# Patient Record
Sex: Female | Born: 1970 | Race: Black or African American | Hispanic: No | Marital: Single | Smoking: Former smoker
Health system: Southern US, Community
[De-identification: ages and names within clinical notes are randomized; demographics above are authoritative.]

## PROBLEM LIST (undated history)

## (undated) DIAGNOSIS — E063 Autoimmune thyroiditis: Secondary | ICD-10-CM

## (undated) DIAGNOSIS — I499 Cardiac arrhythmia, unspecified: Secondary | ICD-10-CM

## (undated) DIAGNOSIS — I10 Essential (primary) hypertension: Secondary | ICD-10-CM

## (undated) DIAGNOSIS — M5136 Other intervertebral disc degeneration, lumbar region: Secondary | ICD-10-CM

## (undated) DIAGNOSIS — E119 Type 2 diabetes mellitus without complications: Secondary | ICD-10-CM

## (undated) DIAGNOSIS — M5030 Other cervical disc degeneration, unspecified cervical region: Secondary | ICD-10-CM

## (undated) DIAGNOSIS — M51369 Other intervertebral disc degeneration, lumbar region without mention of lumbar back pain or lower extremity pain: Secondary | ICD-10-CM

## (undated) DIAGNOSIS — E0500 Thyrotoxicosis with diffuse goiter without thyrotoxic crisis or storm: Secondary | ICD-10-CM

## (undated) HISTORY — PX: CHOLECYSTECTOMY: SHX55

## (undated) HISTORY — PX: HERNIA REPAIR: SHX51

## (undated) HISTORY — PX: CARDIAC SURGERY: SHX584

## (undated) HISTORY — PX: ABDOMINAL HYSTERECTOMY: SHX81

---

## 2008-04-26 ENCOUNTER — Emergency Department (HOSPITAL_BASED_OUTPATIENT_CLINIC_OR_DEPARTMENT_OTHER): Admission: EM | Admit: 2008-04-26 | Discharge: 2008-04-26 | Payer: Self-pay | Admitting: Emergency Medicine

## 2008-04-26 ENCOUNTER — Ambulatory Visit: Payer: Self-pay | Admitting: Diagnostic Radiology

## 2008-05-26 ENCOUNTER — Emergency Department (HOSPITAL_COMMUNITY): Admission: EM | Admit: 2008-05-26 | Discharge: 2008-05-27 | Payer: Self-pay | Admitting: Emergency Medicine

## 2008-07-12 ENCOUNTER — Emergency Department (HOSPITAL_BASED_OUTPATIENT_CLINIC_OR_DEPARTMENT_OTHER): Admission: EM | Admit: 2008-07-12 | Discharge: 2008-07-12 | Payer: Self-pay | Admitting: Emergency Medicine

## 2008-07-24 ENCOUNTER — Emergency Department (HOSPITAL_BASED_OUTPATIENT_CLINIC_OR_DEPARTMENT_OTHER): Admission: EM | Admit: 2008-07-24 | Discharge: 2008-07-24 | Payer: Self-pay | Admitting: Emergency Medicine

## 2008-08-16 ENCOUNTER — Encounter: Admission: RE | Admit: 2008-08-16 | Discharge: 2008-09-22 | Payer: Self-pay | Admitting: Orthopaedic Surgery

## 2008-09-09 ENCOUNTER — Emergency Department (HOSPITAL_BASED_OUTPATIENT_CLINIC_OR_DEPARTMENT_OTHER): Admission: EM | Admit: 2008-09-09 | Discharge: 2008-09-09 | Payer: Self-pay | Admitting: Emergency Medicine

## 2008-10-30 ENCOUNTER — Ambulatory Visit: Payer: Self-pay | Admitting: Diagnostic Radiology

## 2008-10-30 ENCOUNTER — Emergency Department (HOSPITAL_BASED_OUTPATIENT_CLINIC_OR_DEPARTMENT_OTHER): Admission: EM | Admit: 2008-10-30 | Discharge: 2008-10-30 | Payer: Self-pay | Admitting: Emergency Medicine

## 2008-12-01 ENCOUNTER — Emergency Department (HOSPITAL_BASED_OUTPATIENT_CLINIC_OR_DEPARTMENT_OTHER): Admission: EM | Admit: 2008-12-01 | Discharge: 2008-12-02 | Payer: Self-pay | Admitting: Emergency Medicine

## 2008-12-19 ENCOUNTER — Ambulatory Visit (HOSPITAL_COMMUNITY): Admission: RE | Admit: 2008-12-19 | Discharge: 2008-12-19 | Payer: Self-pay | Admitting: General Practice

## 2008-12-20 ENCOUNTER — Encounter: Admission: RE | Admit: 2008-12-20 | Discharge: 2008-12-20 | Payer: Self-pay | Admitting: General Practice

## 2009-02-08 ENCOUNTER — Emergency Department (HOSPITAL_BASED_OUTPATIENT_CLINIC_OR_DEPARTMENT_OTHER): Admission: EM | Admit: 2009-02-08 | Discharge: 2009-02-08 | Payer: Self-pay | Admitting: Emergency Medicine

## 2009-02-15 ENCOUNTER — Emergency Department: Payer: Self-pay | Admitting: Emergency Medicine

## 2009-04-28 ENCOUNTER — Emergency Department (HOSPITAL_BASED_OUTPATIENT_CLINIC_OR_DEPARTMENT_OTHER): Admission: EM | Admit: 2009-04-28 | Discharge: 2009-04-28 | Payer: Self-pay | Admitting: Emergency Medicine

## 2009-09-21 ENCOUNTER — Emergency Department (HOSPITAL_COMMUNITY): Admission: EM | Admit: 2009-09-21 | Discharge: 2009-09-21 | Payer: Self-pay | Admitting: Emergency Medicine

## 2010-05-20 ENCOUNTER — Encounter: Payer: Self-pay | Admitting: Student

## 2010-05-21 ENCOUNTER — Encounter: Payer: Self-pay | Admitting: Obstetrics and Gynecology

## 2010-05-21 ENCOUNTER — Encounter: Payer: Self-pay | Admitting: General Practice

## 2010-07-16 LAB — DIFFERENTIAL
Basophils Absolute: 0.1 10*3/uL (ref 0.0–0.1)
Basophils Relative: 2 % — ABNORMAL HIGH (ref 0–1)
Eosinophils Absolute: 0 10*3/uL (ref 0.0–0.7)
Eosinophils Relative: 1 % (ref 0–5)
Lymphocytes Relative: 36 % (ref 12–46)
Monocytes Absolute: 0.3 10*3/uL (ref 0.1–1.0)

## 2010-07-16 LAB — URINALYSIS, ROUTINE W REFLEX MICROSCOPIC
Bilirubin Urine: NEGATIVE
Nitrite: POSITIVE — AB
Specific Gravity, Urine: 1.01 (ref 1.005–1.030)
Urobilinogen, UA: 0.2 mg/dL (ref 0.0–1.0)

## 2010-07-16 LAB — URINE MICROSCOPIC-ADD ON

## 2010-07-16 LAB — CBC
HCT: 44.7 % (ref 36.0–46.0)
Hemoglobin: 14.6 g/dL (ref 12.0–15.0)
MCV: 88.9 fL (ref 78.0–100.0)
RBC: 5.03 MIL/uL (ref 3.87–5.11)
WBC: 5.9 10*3/uL (ref 4.0–10.5)

## 2010-07-16 LAB — COMPREHENSIVE METABOLIC PANEL
Alkaline Phosphatase: 63 U/L (ref 39–117)
BUN: 3 mg/dL — ABNORMAL LOW (ref 6–23)
CO2: 26 mEq/L (ref 19–32)
Chloride: 104 mEq/L (ref 96–112)
Creatinine, Ser: 0.51 mg/dL (ref 0.4–1.2)
GFR calc non Af Amer: >60 mL/min (ref >60)
Total Bilirubin: 0.5 mg/dL (ref 0.3–1.2)

## 2010-07-16 LAB — TRICYCLICS SCREEN, URINE: TCA Scrn: NOT DETECTED

## 2010-07-16 LAB — RAPID URINE DRUG SCREEN, HOSP PERFORMED
Cocaine: NOT DETECTED
Tetrahydrocannabinol: NOT DETECTED

## 2010-07-16 LAB — POCT PREGNANCY, URINE: Preg Test, Ur: NEGATIVE

## 2010-07-30 LAB — URINALYSIS, ROUTINE W REFLEX MICROSCOPIC
Leukocytes, UA: NEGATIVE
Nitrite: POSITIVE — AB
Specific Gravity, Urine: 1.041 — ABNORMAL HIGH (ref 1.005–1.030)
pH: 6 (ref 5.0–8.0)

## 2010-07-30 LAB — URINE MICROSCOPIC-ADD ON

## 2010-07-31 ENCOUNTER — Emergency Department (INDEPENDENT_AMBULATORY_CARE_PROVIDER_SITE_OTHER): Payer: Medicaid Other

## 2010-07-31 ENCOUNTER — Emergency Department (HOSPITAL_BASED_OUTPATIENT_CLINIC_OR_DEPARTMENT_OTHER)
Admission: EM | Admit: 2010-07-31 | Discharge: 2010-07-31 | Disposition: A | Discharge status: Home / Self Care | Payer: Medicaid Other | Attending: Emergency Medicine | Admitting: Emergency Medicine

## 2010-07-31 DIAGNOSIS — R5381 Other malaise: Secondary | ICD-10-CM | POA: Insufficient documentation

## 2010-07-31 DIAGNOSIS — I4949 Other premature depolarization: Secondary | ICD-10-CM | POA: Insufficient documentation

## 2010-07-31 DIAGNOSIS — Z8679 Personal history of other diseases of the circulatory system: Secondary | ICD-10-CM | POA: Insufficient documentation

## 2010-07-31 DIAGNOSIS — R002 Palpitations: Secondary | ICD-10-CM

## 2010-07-31 DIAGNOSIS — Z79899 Other long term (current) drug therapy: Secondary | ICD-10-CM | POA: Insufficient documentation

## 2010-07-31 DIAGNOSIS — I2510 Atherosclerotic heart disease of native coronary artery without angina pectoris: Secondary | ICD-10-CM | POA: Insufficient documentation

## 2010-07-31 DIAGNOSIS — G8929 Other chronic pain: Secondary | ICD-10-CM | POA: Insufficient documentation

## 2010-07-31 LAB — CBC
MCHC: 35.3 g/dL (ref 30.0–36.0)
RDW: 13.3 % (ref 11.5–15.5)

## 2010-07-31 LAB — BASIC METABOLIC PANEL
CO2: 24 mEq/L (ref 19–32)
Calcium: 9.8 mg/dL (ref 8.4–10.5)
Creatinine, Ser: 0.6 mg/dL (ref 0.4–1.2)
Glucose, Bld: 100 mg/dL — ABNORMAL HIGH (ref 70–99)
Sodium: 140 mEq/L (ref 135–145)

## 2010-07-31 LAB — DIFFERENTIAL
Basophils Absolute: 0 10*3/uL (ref 0.0–0.1)
Basophils Relative: 1 % (ref 0–1)
Eosinophils Absolute: 0.1 10*3/uL (ref 0.0–0.7)
Eosinophils Relative: 2 % (ref 0–5)
Monocytes Absolute: 0.3 10*3/uL (ref 0.1–1.0)

## 2010-07-31 LAB — POCT CARDIAC MARKERS: Myoglobin, poc: 28.2 ng/mL (ref 12–200)

## 2010-08-05 LAB — DIFFERENTIAL
Eosinophils Relative: 0 % (ref 0–5)
Lymphocytes Relative: 31 % (ref 12–46)
Lymphs Abs: 3.1 10*3/uL (ref 0.7–4.0)
Neutro Abs: 6.5 10*3/uL (ref 1.7–7.7)
Neutrophils Relative %: 64 % (ref 43–77)

## 2010-08-05 LAB — CBC
MCHC: 33 g/dL (ref 30.0–36.0)
MCV: 87.6 fL (ref 78.0–100.0)
RBC: 4.95 MIL/uL (ref 3.87–5.11)

## 2010-08-05 LAB — D-DIMER, QUANTITATIVE: D-Dimer, Quant: <0.22 ug/mL-FEU (ref 0.00–0.48)

## 2010-08-05 LAB — POCT CARDIAC MARKERS
CKMB, poc: <1 ng/mL — ABNORMAL LOW (ref 1.0–8.0)
Myoglobin, poc: 28.2 ng/mL (ref 12–200)
Troponin i, poc: <0.05 ng/mL (ref 0.00–0.09)

## 2010-08-05 LAB — COMPREHENSIVE METABOLIC PANEL
AST: 18 U/L (ref 0–37)
BUN: 6 mg/dL (ref 6–23)
CO2: 30 mEq/L (ref 19–32)
Calcium: 9.2 mg/dL (ref 8.4–10.5)
Creatinine, Ser: 0.5 mg/dL (ref 0.4–1.2)
GFR calc Af Amer: >60 mL/min (ref >60)
GFR calc non Af Amer: >60 mL/min (ref >60)

## 2010-08-09 LAB — URINE MICROSCOPIC-ADD ON

## 2010-08-09 LAB — URINALYSIS, ROUTINE W REFLEX MICROSCOPIC
Bilirubin Urine: NEGATIVE
Glucose, UA: NEGATIVE mg/dL
Hgb urine dipstick: NEGATIVE
Specific Gravity, Urine: 1.02 (ref 1.005–1.030)
pH: 6 (ref 5.0–8.0)

## 2014-11-06 ENCOUNTER — Encounter (HOSPITAL_BASED_OUTPATIENT_CLINIC_OR_DEPARTMENT_OTHER): Payer: Self-pay | Admitting: Emergency Medicine

## 2014-11-06 ENCOUNTER — Emergency Department (HOSPITAL_BASED_OUTPATIENT_CLINIC_OR_DEPARTMENT_OTHER)
Admission: EM | Admit: 2014-11-06 | Discharge: 2014-11-06 | Disposition: A | Discharge status: Home / Self Care | Payer: Medicaid Other | Attending: Emergency Medicine | Admitting: Emergency Medicine

## 2014-11-06 DIAGNOSIS — I10 Essential (primary) hypertension: Secondary | ICD-10-CM | POA: Insufficient documentation

## 2014-11-06 DIAGNOSIS — Z8639 Personal history of other endocrine, nutritional and metabolic disease: Secondary | ICD-10-CM | POA: Insufficient documentation

## 2014-11-06 DIAGNOSIS — M2660 Temporomandibular joint disorder, unspecified: Secondary | ICD-10-CM | POA: Insufficient documentation

## 2014-11-06 DIAGNOSIS — M26609 Unspecified temporomandibular joint disorder, unspecified side: Secondary | ICD-10-CM

## 2014-11-06 DIAGNOSIS — Z720 Tobacco use: Secondary | ICD-10-CM | POA: Insufficient documentation

## 2014-11-06 DIAGNOSIS — Z8679 Personal history of other diseases of the circulatory system: Secondary | ICD-10-CM | POA: Insufficient documentation

## 2014-11-06 HISTORY — DX: Thyrotoxicosis with diffuse goiter without thyrotoxic crisis or storm: E05.00

## 2014-11-06 HISTORY — DX: Other cervical disc degeneration, unspecified cervical region: M50.30

## 2014-11-06 HISTORY — DX: Cardiac arrhythmia, unspecified: I49.9

## 2014-11-06 HISTORY — DX: Essential (primary) hypertension: I10

## 2014-11-06 MED ORDER — CYCLOBENZAPRINE HCL 10 MG PO TABS: 10.0000 mg | ORAL_TABLET | Freq: Two times a day (BID) | ORAL | Status: AC | PRN

## 2014-11-06 NOTE — ED Provider Notes (Signed)
CSN: [id number]     Arrival date & time 11/06/14  2153 History   First MD Initiated Contact with Patient 11/06/14 2227     Chief Complaint  Patient presents with  . Jaw Pain     (Consider location/radiation/quality/duration/timing/severity/associated sxs/prior Treatment) HPI   Blood pressure 129/99, pulse 103, temperature 98 F (36.7 C), temperature source Oral, resp. rate 18, height 5\' 7"  (1.702 m), weight 235 lb (106.595 kg), SpO2 96 %.  Danielle Fernandez is a 44 y.o. female complaining of locking and popping to her jaw worsening on the right intermittently over the last several weeks worsening over the last 3 days. States that the pain radiates to the ear, states it's worse at night. Patient wears full dentures, she takes them out at night. She's been applying cold and warm compresses. Patient had leftover flexeril and she took that with some relief.  Past Medical History  Diagnosis Date  . Hypertension   . Graves disease   . Degenerative cervical disc   . Arrhythmia    Past Surgical History  Procedure Laterality Date  . Cardiac surgery     History reviewed. No pertinent family history. History  Substance Use Topics  . Smoking status: Current Every Day Smoker  . Smokeless tobacco: Not on file  . Alcohol Use: Yes     Comment: occasional   OB History    No data available     Review of Systems  10 systems reviewed and found to be negative, except as noted in the HPI.   Allergies  Review of patient's allergies indicates no known allergies.  Home Medications   Prior to Admission medications   Medication Sig Start Date End Date Taking? Authorizing Provider  cyclobenzaprine (FLEXERIL) 10 MG tablet Take 1 tablet (10 mg total) by mouth 2 (two) times daily as needed for muscle spasms. 11/06/14   Charleene Callegari, PA-C   BP 129/99 mmHg  Pulse 103  Temp(Src) 98 F (36.7 C) (Oral)  Resp 18  Ht 5\' 7"  (1.702 m)  Wt 235 lb (106.595 kg)  BMI 36.80 kg/m2  SpO2 96% Physical  Exam  Constitutional: She is oriented to person, place, and time. She appears well-developed and well-nourished. No distress.  HENT:  Head: Normocephalic and atraumatic.  Mouth/Throat: Oropharynx is clear and moist.  No intraoral lesions, full range of motion to jaw, no popping or clicking appreciated on opening jaw. Tympanic membrane with normal architecture and good light reflex.  Eyes: Conjunctivae and EOM are normal.  Cardiovascular: Normal rate and regular rhythm.   Pulmonary/Chest: Effort normal and breath sounds normal. No stridor.  Musculoskeletal: Normal range of motion.  Neurological: She is alert and oriented to person, place, and time.  Psychiatric: She has a normal mood and affect.  Nursing note and vitals reviewed.   ED Course  Procedures (including critical care time) Labs Review Labs Reviewed - No data to display  Imaging Review No results found.   EKG Interpretation None      MDM   Final diagnoses:  TMJ (temporomandibular joint disorder)    Filed Vitals:   11/06/14 2202  BP: 129/99  Pulse: 103  Temp: 98 F (36.7 C)  TempSrc: Oral  Resp: 18  Height: 5\' 7"  (1.702 m)  Weight: 235 lb (106.595 kg)  SpO2: 96%    Danielle Fernandez is a pleasant 44 y.o. female presenting with jaw pain and locking intermittently with ear and temple pain worsening over the course of several weeks. No malocclusion full  range of motion to jaw, a cleat TMJ disorder. Advised her to obtain over-the-counter mouth guard and will write her for muscle relaxer which she's taking before bedtime. Advised to follow closely with her PCP.  Evaluation does not show pathology that would require ongoing emergent intervention or inpatient treatment. Pt is hemodynamically stable and mentating appropriately. Discussed findings and plan with patient/guardian, who agrees with care plan. All questions answered. Return precautions discussed and outpatient follow up given.   Discharge Medication List as of  11/06/2014 10:43 PM    START taking these medications   Details  cyclobenzaprine (FLEXERIL) 10 MG tablet Take 1 tablet (10 mg total) by mouth 2 (two) times daily as needed for muscle spasms., Starting 11/06/2014, Until Discontinued, [employer], PA-C 11/06/14 1191  Margarita Grizzle, MD 11/07/14 [phone]

## 2014-11-06 NOTE — ED Notes (Signed)
Pt in c/o her jaw locking intermittently and causing pain into her temple and ear x several weeks but worse x 3 days. NAD noted.

## 2014-11-06 NOTE — Discharge Instructions (Signed)
Take the Flexeril before bedtime. you can also try an over-the-counter mouthguard.   Please follow with your primary care doctor in the next 2 days for a check-up. They must obtain records for further management.   Do not hesitate to return to the Emergency Department for any new, worsening or concerning symptoms.   Temporomandibular Problems  Temporomandibular joint (TMJ) dysfunction means there are problems with the joint between your jaw and your skull. This is a joint lined by cartilage like other joints in your body but also has a small disc in the joint which keeps the bones from rubbing on each other. These joints are like other joints and can get inflamed (sore) from arthritis and other problems. When this joint gets sore, it can cause headaches and pain in the jaw and the face. CAUSES  Usually the arthritic types of problems are caused by soreness in the joint. Soreness in the joint can also be caused by overuse. This may come from grinding your teeth. It may also come from mis-alignment in the joint. DIAGNOSIS Diagnosis of this condition can often be made by history and exam. Sometimes your caregiver may need X-rays or an MRI scan to determine the exact cause. It may be necessary to see your dentist to determine if your teeth and jaws are lined up correctly. TREATMENT  Most of the time this problem is not serious; however, sometimes it can persist (become chronic). When this happens medications that will cut down on inflammation (soreness) help. Sometimes a shot of cortisone into the joint will be helpful. If your teeth are not aligned it may help for your dentist to make a splint for your mouth that can help this problem. If no physical problems can be found, the problem may come from tension. If tension is found to be the cause, biofeedback or relaxation techniques may be helpful. HOME CARE INSTRUCTIONS   Later in the day, applications of ice packs may be helpful. Ice can be used in a  plastic bag with a towel around it to prevent frostbite to skin. This may be used about every 2 hours for 20 to 30 minutes, as needed while awake, or as directed by your caregiver.  Only take over-the-counter or prescription medicines for pain, discomfort, or fever as directed by your caregiver.  If physical therapy was prescribed, follow your caregiver's directions.  Wear mouth appliances as directed if they were given. Document Released: 01/08/2001 Document Revised: 07/08/2011 Document Reviewed: 04/17/2008 [facility] Patient Information 2015 [employer], [place]. This information is not intended to replace advice given to you by your health care provider. Make sure you discuss any questions you have with your health care provider.

## 2015-01-26 ENCOUNTER — Emergency Department (HOSPITAL_BASED_OUTPATIENT_CLINIC_OR_DEPARTMENT_OTHER)
Admission: EM | Admit: 2015-01-26 | Discharge: 2015-01-27 | Disposition: A | Discharge status: Home / Self Care | Payer: Medicaid Other | Attending: Emergency Medicine | Admitting: Emergency Medicine

## 2015-01-26 ENCOUNTER — Encounter (HOSPITAL_BASED_OUTPATIENT_CLINIC_OR_DEPARTMENT_OTHER): Payer: Self-pay

## 2015-01-26 DIAGNOSIS — N762 Acute vulvitis: Secondary | ICD-10-CM | POA: Insufficient documentation

## 2015-01-26 DIAGNOSIS — Z8639 Personal history of other endocrine, nutritional and metabolic disease: Secondary | ICD-10-CM | POA: Insufficient documentation

## 2015-01-26 DIAGNOSIS — Z720 Tobacco use: Secondary | ICD-10-CM | POA: Insufficient documentation

## 2015-01-26 DIAGNOSIS — I10 Essential (primary) hypertension: Secondary | ICD-10-CM | POA: Insufficient documentation

## 2015-01-26 DIAGNOSIS — N764 Abscess of vulva: Secondary | ICD-10-CM | POA: Insufficient documentation

## 2015-01-26 DIAGNOSIS — Z8659 Personal history of other mental and behavioral disorders: Secondary | ICD-10-CM | POA: Insufficient documentation

## 2015-01-26 DIAGNOSIS — Z791 Long term (current) use of non-steroidal anti-inflammatories (NSAID): Secondary | ICD-10-CM | POA: Insufficient documentation

## 2015-01-26 DIAGNOSIS — L0291 Cutaneous abscess, unspecified: Secondary | ICD-10-CM

## 2015-01-26 DIAGNOSIS — L0390 Cellulitis, unspecified: Secondary | ICD-10-CM

## 2015-01-26 HISTORY — DX: Other intervertebral disc degeneration, lumbar region without mention of lumbar back pain or lower extremity pain: M51.369

## 2015-01-26 HISTORY — DX: Other intervertebral disc degeneration, lumbar region: M51.36

## 2015-01-26 NOTE — ED Notes (Signed)
Pt states she has abscess to left side of pubic region for the last two days.

## 2015-01-27 ENCOUNTER — Encounter (HOSPITAL_BASED_OUTPATIENT_CLINIC_OR_DEPARTMENT_OTHER): Payer: Self-pay | Admitting: Emergency Medicine

## 2015-01-27 MED ORDER — MELOXICAM 15 MG PO TABS
15.0000 mg | ORAL_TABLET | Freq: Every day | ORAL | Status: DC
Start: 2015-01-27 — End: 2015-04-19

## 2015-01-27 MED ORDER — SULFAMETHOXAZOLE-TRIMETHOPRIM 800-160 MG PO TABS: 1.0000 | ORAL_TABLET | Freq: Two times a day (BID) | ORAL | Status: AC

## 2015-01-27 MED ORDER — MUPIROCIN CALCIUM 2 % EX CREA: 1.0000 "application " | TOPICAL_CREAM | Freq: Two times a day (BID) | CUTANEOUS | Status: DC

## 2015-01-27 MED ORDER — SULFAMETHOXAZOLE-TRIMETHOPRIM 800-160 MG PO TABS
1.0000 | ORAL_TABLET | Freq: Once | ORAL | Status: AC
  Administered 2015-01-27: 1 via ORAL
  Filled 2015-01-27: qty 1

## 2015-01-27 MED ORDER — FLUCONAZOLE 150 MG PO TABS: 150.0000 mg | ORAL_TABLET | Freq: Once | ORAL | Status: DC

## 2015-01-27 NOTE — ED Provider Notes (Signed)
CSN: [id number]     Arrival date & time 01/26/15  2207 History   First MD Initiated Contact with Patient 01/27/15 0014     Chief Complaint  Patient presents with  . Abscess     (Consider location/radiation/quality/duration/timing/severity/associated sxs/prior Treatment) Patient is a 44 y.o. female presenting with abscess. The history is provided by the patient.  Abscess Location:  Ano-genital Ano-genital abscess location:  Vulva Abscess quality: painful and redness   Abscess quality: not draining and no fluctuance   Red streaking: no   Progression:  Worsening Pain details:    Quality:  Dull   Severity:  Severe   Timing:  Constant   Progression:  Worsening Chronicity:  Recurrent Context: not diabetes   Relieved by:  Nothing Exacerbated by: shaving the affected area. Ineffective treatments: opana and oxy. Associated symptoms: no anorexia, no fever, no nausea and no vomiting   Risk factors: prior abscess     Past Medical History  Diagnosis Date  . Hypertension   . Graves disease   . Degenerative cervical disc   . Arrhythmia   . Degenerative disc disease, lumbar    Past Surgical History  Procedure Laterality Date  . Cardiac surgery    . Abdominal hysterectomy     History reviewed. No pertinent family history. Social History  Substance Use Topics  . Smoking status: Current Every Day Smoker  . Smokeless tobacco: None  . Alcohol Use: Yes     Comment: occasional   OB History    No data available     Review of Systems  Constitutional: Negative for fever.  Gastrointestinal: Negative for nausea, vomiting, diarrhea and anorexia.  Skin: Positive for color change.  All other systems reviewed and are negative.     Allergies  Review of patient's allergies indicates no known allergies.  Home Medications   Prior to Admission medications   Medication Sig Start Date End Date Taking? Authorizing Provider  cyclobenzaprine (FLEXERIL) 10 MG tablet Take 1 tablet (10 mg  total) by mouth 2 (two) times daily as needed for muscle spasms. 11/06/14   Nicole Pisciotta, PA-C  fluconazole (DIFLUCAN) 150 MG tablet Take 1 tablet (150 mg total) by mouth once. 01/27/15   April Palumbo, MD  meloxicam (MOBIC) 15 MG tablet Take 1 tablet (15 mg total) by mouth daily. 01/27/15   April Palumbo, MD  mupirocin cream (BACTROBAN) 2 % Apply 1 application topically 2 (two) times daily. 01/27/15   April Palumbo, MD  sulfamethoxazole-trimethoprim (BACTRIM DS,SEPTRA DS) 800-160 MG tablet Take 1 tablet by mouth 2 (two) times daily. 01/27/15 02/03/15  April Palumbo, MD   BP 126/89 mmHg  Pulse 96  Temp(Src) 98.2 F (36.8 C) (Oral)  Resp 18  Ht  (1.702 m)  Wt 240 lb (108.863 kg)  BMI 37.58 kg/m2  SpO2 97% Physical Exam  Constitutional: She is oriented to person, place, and time. She appears well-developed and well-nourished. No distress.  HENT:  Head: Normocephalic and atraumatic.  Mouth/Throat: Oropharynx is clear and moist.  Eyes: Conjunctivae are normal. Pupils are equal, round, and reactive to light.  Neck: Normal range of motion. Neck supple.  Cardiovascular: Normal rate, regular rhythm and intact distal pulses.   Pulmonary/Chest: Effort normal and breath sounds normal. No respiratory distress. She has no wheezes. She has no rales.  Abdominal: Soft. Bowel sounds are normal. There is no tenderness. There is no rebound and no guarding.  Genitourinary:     Chaperone present  Musculoskeletal: Normal range of motion.  Neurological: She is alert and oriented to person, place, and time.  Skin: Skin is warm and dry.  Psychiatric: She has a normal mood and affect.    ED Course  Procedures (including critical care time) Labs Review Labs Reviewed - No data to display  Imaging Review No results found. I have personally reviewed and evaluated these images and lab results as part of my medical decision-making.   EKG Interpretation None      MDM   Final diagnoses:    Cellulitis and abscess   No abscess at this time.  Either the scab is where it already drained to there are a second area.  At this time it is only cellulitis which we will treat with bactrim and mobic as she is already taking 2 narcotics.  Strict return precautions given.      Cy Blamer, MD 01/27/15 [phone]

## 2015-01-27 NOTE — ED Notes (Signed)
C/o abscess tp rt pelvic area x 2 days

## 2015-01-27 NOTE — Discharge Instructions (Signed)

## 2015-04-19 ENCOUNTER — Emergency Department (HOSPITAL_BASED_OUTPATIENT_CLINIC_OR_DEPARTMENT_OTHER): Payer: Medicaid Other

## 2015-04-19 ENCOUNTER — Emergency Department (HOSPITAL_BASED_OUTPATIENT_CLINIC_OR_DEPARTMENT_OTHER)
Admission: EM | Admit: 2015-04-19 | Discharge: 2015-04-19 | Disposition: A | Discharge status: Home / Self Care | Payer: Medicaid Other | Attending: Emergency Medicine | Admitting: Emergency Medicine

## 2015-04-19 ENCOUNTER — Encounter (HOSPITAL_BASED_OUTPATIENT_CLINIC_OR_DEPARTMENT_OTHER): Payer: Self-pay

## 2015-04-19 DIAGNOSIS — Z8739 Personal history of other diseases of the musculoskeletal system and connective tissue: Secondary | ICD-10-CM | POA: Insufficient documentation

## 2015-04-19 DIAGNOSIS — K8020 Calculus of gallbladder without cholecystitis without obstruction: Secondary | ICD-10-CM

## 2015-04-19 DIAGNOSIS — Z79899 Other long term (current) drug therapy: Secondary | ICD-10-CM | POA: Insufficient documentation

## 2015-04-19 DIAGNOSIS — Z8639 Personal history of other endocrine, nutritional and metabolic disease: Secondary | ICD-10-CM | POA: Insufficient documentation

## 2015-04-19 DIAGNOSIS — I10 Essential (primary) hypertension: Secondary | ICD-10-CM | POA: Insufficient documentation

## 2015-04-19 DIAGNOSIS — R1011 Right upper quadrant pain: Secondary | ICD-10-CM

## 2015-04-19 DIAGNOSIS — Z90710 Acquired absence of both cervix and uterus: Secondary | ICD-10-CM | POA: Insufficient documentation

## 2015-04-19 DIAGNOSIS — Z9889 Other specified postprocedural states: Secondary | ICD-10-CM | POA: Insufficient documentation

## 2015-04-19 DIAGNOSIS — F17200 Nicotine dependence, unspecified, uncomplicated: Secondary | ICD-10-CM | POA: Insufficient documentation

## 2015-04-19 LAB — COMPREHENSIVE METABOLIC PANEL WITH GFR
ALT: 12 U/L — ABNORMAL LOW (ref 14–54)
AST: 14 U/L — ABNORMAL LOW (ref 15–41)
Albumin: 4 g/dL (ref 3.5–5.0)
Alkaline Phosphatase: 78 U/L (ref 38–126)
Anion gap: 6 (ref 5–15)
BUN: 8 mg/dL (ref 6–20)
CO2: 31 mmol/L (ref 22–32)
Calcium: 8.9 mg/dL (ref 8.9–10.3)
Chloride: 100 mmol/L — ABNORMAL LOW (ref 101–111)
Creatinine, Ser: 0.67 mg/dL (ref 0.44–1.00)
GFR calc Af Amer: >60 mL/min
GFR calc non Af Amer: >60 mL/min
Glucose, Bld: 140 mg/dL — ABNORMAL HIGH (ref 65–99)
Potassium: 3 mmol/L — ABNORMAL LOW (ref 3.5–5.1)
Sodium: 137 mmol/L (ref 135–145)
Total Bilirubin: 0.3 mg/dL (ref 0.3–1.2)
Total Protein: 7.5 g/dL (ref 6.5–8.1)

## 2015-04-19 LAB — URINALYSIS, ROUTINE W REFLEX MICROSCOPIC
BILIRUBIN URINE: NEGATIVE
Glucose, UA: NEGATIVE mg/dL
HGB URINE DIPSTICK: NEGATIVE
Ketones, ur: NEGATIVE mg/dL
Nitrite: NEGATIVE
PH: 8.5 — AB (ref 5.0–8.0)
Protein, ur: NEGATIVE mg/dL
SPECIFIC GRAVITY, URINE: 1.023 (ref 1.005–1.030)

## 2015-04-19 LAB — CBC WITH DIFFERENTIAL/PLATELET
Basophils Absolute: 0 K/uL (ref 0.0–0.1)
Basophils Relative: 1 %
Eosinophils Absolute: 0.2 K/uL (ref 0.0–0.7)
Eosinophils Relative: 2 %
HCT: 42.1 % (ref 36.0–46.0)
Hemoglobin: 13.6 g/dL (ref 12.0–15.0)
Lymphocytes Relative: 50 %
Lymphs Abs: 3.3 K/uL (ref 0.7–4.0)
MCH: 26.8 pg (ref 26.0–34.0)
MCHC: 32.3 g/dL (ref 30.0–36.0)
MCV: 83 fL (ref 78.0–100.0)
Monocytes Absolute: 0.4 K/uL (ref 0.1–1.0)
Monocytes Relative: 6 %
Neutro Abs: 2.6 K/uL (ref 1.7–7.7)
Neutrophils Relative %: 41 %
Platelets: 294 K/uL (ref 150–400)
RBC: 5.07 MIL/uL (ref 3.87–5.11)
RDW: 13.4 % (ref 11.5–15.5)
WBC: 6.5 K/uL (ref 4.0–10.5)

## 2015-04-19 LAB — LIPASE, BLOOD: Lipase: 19 U/L (ref 11–51)

## 2015-04-19 LAB — URINE MICROSCOPIC-ADD ON: RBC / HPF: NONE SEEN RBC/hpf (ref 0–5)

## 2015-04-19 MED ORDER — MORPHINE SULFATE (PF) 4 MG/ML IV SOLN
4.0000 mg | Freq: Once | INTRAVENOUS | Status: AC
  Administered 2015-04-19: 4 mg via INTRAVENOUS
  Filled 2015-04-19: qty 1

## 2015-04-19 MED ORDER — SODIUM CHLORIDE 0.9 % IV BOLUS (SEPSIS)
500.0000 mL | Freq: Once | INTRAVENOUS | Status: AC
  Administered 2015-04-19: 500 mL via INTRAVENOUS

## 2015-04-19 MED ORDER — ONDANSETRON 4 MG PO TBDP: 4.0000 mg | ORAL_TABLET | Freq: Three times a day (TID) | ORAL | Status: DC | PRN

## 2015-04-19 MED ORDER — POTASSIUM CHLORIDE CRYS ER 20 MEQ PO TBCR
40.0000 meq | EXTENDED_RELEASE_TABLET | Freq: Once | ORAL | Status: AC
  Administered 2015-04-19: 40 meq via ORAL
  Filled 2015-04-19: qty 2

## 2015-04-19 MED ORDER — DIPHENHYDRAMINE HCL 50 MG/ML IJ SOLN
12.5000 mg | Freq: Once | INTRAMUSCULAR | Status: AC
  Administered 2015-04-19: 12.5 mg via INTRAVENOUS
  Filled 2015-04-19: qty 1

## 2015-04-19 NOTE — ED Notes (Signed)
Pt c/o itching. PA aware orders given.

## 2015-04-19 NOTE — Discharge Instructions (Signed)
Biliary Colic °Biliary colic is a pain in the upper abdomen. The pain: °· Is usually felt on the right side of the abdomen, but it may also be felt in the center of the abdomen, just below the breastbone (sternum). °· May spread back toward the right shoulder blade. °· May be steady or irregular. °· May be accompanied by nausea and vomiting. °Most of the time, the pain goes away in 1-5 hours. After the most intense pain passes, the abdomen may continue to ache mildly for about 24 hours. °Biliary colic is caused by a blockage in the bile duct. The bile duct is a pathway that carries bile--a liquid that helps to digest fats--from the gallbladder to the small intestine. Biliary colic usually occurs after eating, when the digestive system demands bile. The pain develops when muscle cells contract forcefully to try to move the blockage so that bile can get by. °HOME CARE INSTRUCTIONS °· Take medicines only as directed by your health care provider. °· Drink enough fluid to keep your urine clear or pale yellow. °· Avoid fatty, greasy, and fried foods. These kinds of foods increase your body's demand for bile. °· Avoid any foods that make your pain worse. °· Avoid overeating. °· Avoid having a large meal after fasting. °SEEK MEDICAL CARE IF: °· You develop a fever. °· Your pain gets worse. °· You vomit. °· You develop nausea that prevents you from eating and drinking. °SEEK IMMEDIATE MEDICAL CARE IF: °· You suddenly develop a fever and shaking chills. °· You develop a yellowish discoloration (jaundice) of: °· Skin. °· Whites of the eyes. °· Mucous membranes. °· You have continuous or severe pain that is not relieved with medicines. °· You have nausea and vomiting that is not relieved with medicines. °· You develop dizziness or you faint. °  °This information is not intended to replace advice given to you by your health care provider. Make sure you discuss any questions you have with your health care provider. °  °Document  Released: 09/16/2005 Document Revised: 08/30/2014 Document Reviewed: 01/25/2014 °Elsevier Interactive Patient Education ©2016 [employer] ° °Cholelithiasis °Cholelithiasis (also called gallstones) is a form of gallbladder disease in which gallstones form in your gallbladder. The gallbladder is an organ that stores bile made in the liver, which helps digest fats. Gallstones begin as small crystals and slowly grow into stones. Gallstone pain occurs when the gallbladder spasms and a gallstone is blocking the duct. Pain can also occur when a stone passes out of the duct.  °RISK FACTORS °· Being female.   °· Having multiple pregnancies. Health care providers sometimes advise removing diseased gallbladders before future pregnancies.   °· Being obese. °· Eating a diet heavy in fried foods and fat.   °· Being older than 60 years and increasing age.   °· Prolonged use of medicines containing female hormones.   °· Having diabetes mellitus.   °· Rapidly losing weight.   °· Having a family history of gallstones (heredity).   °SYMPTOMS °· Nausea.   °· Vomiting. °· Abdominal pain.   °· Yellowing of the skin (jaundice).   °· Sudden pain. It may persist from several minutes to several hours. °· Fever.   °· Tenderness to the touch.  °In some cases, when gallstones do not move into the bile duct, people have no pain or symptoms. These are called "silent" gallstones.  °TREATMENT °Silent gallstones do not need treatment. In severe cases, emergency surgery may be required. Options for treatment include: °· Surgery to remove the gallbladder. This is the most common treatment. °·   Medicines. These do not always work and may take 6-12 months or more to work.  Shock wave treatment (extracorporeal biliary lithotripsy). In this treatment an ultrasound machine sends shock waves to the gallbladder to break gallstones into smaller pieces that can pass into the intestines or be dissolved by medicine. HOME CARE INSTRUCTIONS   Only take  over-the-counter or prescription medicines for pain, discomfort, or fever as directed by your health care provider.   Follow a low-fat diet until seen again by your health care provider. Fat causes the gallbladder to contract, which can result in pain.   Follow up with your health care provider as directed. Attacks are almost always recurrent and surgery is usually required for permanent treatment.  SEEK IMMEDIATE MEDICAL CARE IF:   Your pain increases and is not controlled by medicines.   You have a fever or persistent symptoms for more than 2-3 days.   You have a fever and your symptoms suddenly get worse.   You have persistent nausea and vomiting.  MAKE SURE YOU:   Understand these instructions.  Will watch your condition.  Will get help right away if you are not doing well or get worse.   This information is not intended to replace advice given to you by your health care provider. Make sure you discuss any questions you have with your health care provider.  Follow-up with a general surgeon as soon as possible for consultation and reevaluation. Encourage a bland diet. Avoid fatty and spicy foods. Return to the emergency department if you experience severe increase in your pain, fever, vomiting, yellowing of the skin, blood in your stool.

## 2015-04-19 NOTE — ED Provider Notes (Signed)
CSN: [id number]     Arrival date & time 04/19/15  1501 History   First MD Initiated Contact with Patient 04/19/15 1511     Chief Complaint  Patient presents with  . Abdominal Pain     (Consider location/radiation/quality/duration/timing/severity/associated sxs/prior Treatment) HPI  Kathy BreachRuby Ruhlman is a 44 y.o F who presents the emergency room today complaining of abdominal pain. Patient states that she began experiencing gradual onset right upper quadrant abdominal pain 3 days ago. Patient has been progressively worsened and is now unbearable. Pain is constant, nonradiating. She has been taken home prescription narcotics for this with no relief. Patient states that she had french fries and hot sauce today and soon after she ate this the pain significantly worse. Patient is having regular bowel movements, no melena or hematochezia. Patient denies associated nausea, vomiting, diarrhea, fever, dysuria, vaginal discharge, vaginal bleeding. Past surgical history includes abdominal hysterectomy.  Past Medical History  Diagnosis Date  . Hypertension   . Graves disease   . Degenerative cervical disc   . Arrhythmia   . Degenerative disc disease, lumbar    Past Surgical History  Procedure Laterality Date  . Cardiac surgery    . Abdominal hysterectomy     No family history on file. Social History  Substance Use Topics  . Smoking status: Current Every Day Smoker  . Smokeless tobacco: None  . Alcohol Use: Yes     Comment: occasional   OB History    No data available     Review of Systems  All other systems reviewed and are negative.     Allergies  Review of patient's allergies indicates no known allergies.  Home Medications   Prior to Admission medications   Medication Sig Start Date End Date Taking? Authorizing Provider  ALPRAZolam (XANAX PO) Take by mouth.   Yes Historical Provider, MD  GABAPENTIN PO Take by mouth.   Yes Historical Provider, MD  LISINOPRIL PO Take by mouth.    Yes Historical Provider, MD  OxyMORphone HCl (OPANA PO) Take by mouth.   Yes Historical Provider, MD  cyclobenzaprine (FLEXERIL) 10 MG tablet Take 1 tablet (10 mg total) by mouth 2 (two) times daily as needed for muscle spasms. 11/06/14   Nicole Pisciotta, PA-C  mupirocin cream (BACTROBAN) 2 % Apply 1 application topically 2 (two) times daily. 01/27/15   April Palumbo, MD  ondansetron (ZOFRAN ODT) 4 MG disintegrating tablet Take 1 tablet (4 mg total) by mouth every 8 (eight) hours as needed for nausea or vomiting. 04/19/15   Namira Rosekrans Tripp Jaime Dome, PA-C   BP 113/78 mmHg  Pulse 86  Temp(Src) 98.4 F (36.9 C) (Oral)  Resp 16  Ht 5\' 7"  (1.702 m)  Wt 111.131 kg  BMI 38.36 kg/m2  SpO2 100% Physical Exam  Constitutional: She is oriented to person, place, and time. She appears well-developed and well-nourished. No distress.  HENT:  Head: Normocephalic and atraumatic.  Mouth/Throat: No oropharyngeal exudate.  Eyes: Conjunctivae and EOM are normal. Pupils are equal, round, and reactive to light. Right eye exhibits no discharge. Left eye exhibits no discharge. No scleral icterus.  Cardiovascular: Normal rate, regular rhythm, normal heart sounds and intact distal pulses.  Exam reveals no gallop and no friction rub.   No murmur heard. Pulmonary/Chest: Effort normal and breath sounds normal. No respiratory distress. She has no wheezes. She has no rales. She exhibits no tenderness.  Abdominal: Soft. Bowel sounds are normal. She exhibits no distension. There is tenderness. There is no rebound  and no guarding.  RUQ tenderness to palpation. Positive Murphy sign. No peritoneal signs. No CVA tenderness. No palpable mass.  Musculoskeletal: Normal range of motion. She exhibits no edema.  Neurological: She is alert and oriented to person, place, and time.  Skin: Skin is warm and dry. No rash noted. She is not diaphoretic. No erythema. No pallor.  No jaundice.  Psychiatric: She has a normal mood and affect. Her  behavior is normal.  Nursing note and vitals reviewed.   ED Course  Procedures (including critical care time) Labs Review Labs Reviewed  URINALYSIS, ROUTINE W REFLEX MICROSCOPIC (NOT AT [facility]) - Abnormal; Notable for the following:    pH 8.5 (*)    Leukocytes, UA SMALL (*)    All other components within normal limits  URINE MICROSCOPIC-ADD ON - Abnormal; Notable for the following:    Squamous Epithelial / LPF 0-5 (*)    Bacteria, UA RARE (*)    All other components within normal limits  COMPREHENSIVE METABOLIC PANEL - Abnormal; Notable for the following:    Potassium 3.0 (*)    Chloride 100 (*)    Glucose, Bld 140 (*)    AST 14 (*)    ALT 12 (*)    All other components within normal limits  CBC WITH DIFFERENTIAL/PLATELET  LIPASE, BLOOD    Imaging Review US Abdomen Complete  04/19/2015  CLINICAL DATA:  Right upper quadrant pain for 2 days. EXAM: ABDOMEN ULTRASOUND COMPLETE COMPARISON:  None. FINDINGS: Gallbladder: The gallbladder is full of stones. The gallbladder wall measures 2.6 mm in thickness. Positive sonographic Murphy's sign. Common bile duct: Diameter: 1.4 mm. Liver: No focal liver abnormality. IVC: No abnormality visualized. Pancreas: Visualized portion unremarkable. Spleen: Size and appearance within normal limits. Right Kidney: Length: 11.6 cm. Echogenicity within normal limits. No mass or hydronephrosis visualized. Left Kidney: Length: 12.4 cm. Echogenicity within normal limits. No mass or hydronephrosis visualized. Abdominal aorta: No aneurysm visualized. Other findings: None. IMPRESSION: 1. Gallstones. The gallbladder wall is upper limits of normal in thickness. No pericholecystic fluid. There is a positive sonographic Murphy's sign. Correlate for any clinical signs or symptoms of acute cholecystitis. Electronically Signed   By: Signa Kell M.D.   On: 04/19/2015 17:19   I have personally reviewed and evaluated these images and lab results as part of my medical  decision-making.   EKG Interpretation None      MDM   Final diagnoses:  RUQ pain  Calculus of gallbladder without cholecystitis without obstruction    44 year old. He presents with 3 day onset of right upper quadrant abdominal pain. No associated vomiting, fever. Pain is increased with eating fatty and spicy foods. On exam patient is significantly tender in the right upper quadrant, positive Murphy's sign. We'll obtain right upper quadrant ultrasound as well as lab work. Patient given IV fluids and pain medication.  Potassium is low at 3.0, repleted in the emergency department. All other lab work within normal limits. No evidence of obstruction or transaminitis. Right upper quadrant ultrasound reveals many gallstones with a positive sonographic Murphy sign. Patient is afebrile in ED. No leukocytosis. No jaundice. Do not suspect cholecystitis, biliary obstruction or ascending cholangitis at this time. We will refer patient to general surgery for follow-up evaluation. Patient is under pain contract, she may take her home prescriptions as needed for pain. Encourage bland diet, avoid spicy and fatty foods. Discussed treatment plan with patient who is agreeable. Return precautions outlined in patient discharge instructions.  Case discussed with  Dr. Fayrene Fearing who is agreeable to treatment plan.    Lester Kinsman [place], PA-C 04/19/15 2202  Rolland Porter, MD 05/02/15 [phone]

## 2015-04-19 NOTE — ED Notes (Signed)
C/o abd pain-denies n/v/d-feels bladder is not emptying out

## 2015-04-21 ENCOUNTER — Emergency Department (HOSPITAL_BASED_OUTPATIENT_CLINIC_OR_DEPARTMENT_OTHER)
Admission: EM | Admit: 2015-04-21 | Discharge: 2015-04-21 | Disposition: A | Discharge status: Home / Self Care | Payer: Medicaid Other | Attending: Emergency Medicine | Admitting: Emergency Medicine

## 2015-04-21 ENCOUNTER — Encounter (HOSPITAL_BASED_OUTPATIENT_CLINIC_OR_DEPARTMENT_OTHER): Payer: Self-pay | Admitting: *Deleted

## 2015-04-21 DIAGNOSIS — K8030 Calculus of bile duct with cholangitis, unspecified, without obstruction: Secondary | ICD-10-CM | POA: Insufficient documentation

## 2015-04-21 DIAGNOSIS — Z79899 Other long term (current) drug therapy: Secondary | ICD-10-CM | POA: Insufficient documentation

## 2015-04-21 DIAGNOSIS — Z90710 Acquired absence of both cervix and uterus: Secondary | ICD-10-CM | POA: Insufficient documentation

## 2015-04-21 DIAGNOSIS — Z8639 Personal history of other endocrine, nutritional and metabolic disease: Secondary | ICD-10-CM | POA: Insufficient documentation

## 2015-04-21 DIAGNOSIS — K8050 Calculus of bile duct without cholangitis or cholecystitis without obstruction: Secondary | ICD-10-CM

## 2015-04-21 DIAGNOSIS — Z9889 Other specified postprocedural states: Secondary | ICD-10-CM | POA: Insufficient documentation

## 2015-04-21 DIAGNOSIS — F419 Anxiety disorder, unspecified: Secondary | ICD-10-CM

## 2015-04-21 DIAGNOSIS — I10 Essential (primary) hypertension: Secondary | ICD-10-CM | POA: Insufficient documentation

## 2015-04-21 DIAGNOSIS — F17200 Nicotine dependence, unspecified, uncomplicated: Secondary | ICD-10-CM | POA: Insufficient documentation

## 2015-04-21 LAB — COMPREHENSIVE METABOLIC PANEL
ALK PHOS: 69 U/L (ref 38–126)
ALT: 8 U/L — AB (ref 14–54)
AST: 14 U/L — ABNORMAL LOW (ref 15–41)
Albumin: 3.8 g/dL (ref 3.5–5.0)
Anion gap: 6 (ref 5–15)
BUN: 7 mg/dL (ref 6–20)
CALCIUM: 8.8 mg/dL — AB (ref 8.9–10.3)
CO2: 28 mmol/L (ref 22–32)
CREATININE: 0.56 mg/dL (ref 0.44–1.00)
Chloride: 103 mmol/L (ref 101–111)
Glucose, Bld: 124 mg/dL — ABNORMAL HIGH (ref 65–99)
Potassium: 3.6 mmol/L (ref 3.5–5.1)
SODIUM: 137 mmol/L (ref 135–145)
Total Bilirubin: 0.4 mg/dL (ref 0.3–1.2)
Total Protein: 7.4 g/dL (ref 6.5–8.1)

## 2015-04-21 LAB — CBC
HCT: 42.5 % (ref 36.0–46.0)
Hemoglobin: 13.9 g/dL (ref 12.0–15.0)
MCH: 27.3 pg (ref 26.0–34.0)
MCHC: 32.7 g/dL (ref 30.0–36.0)
MCV: 83.3 fL (ref 78.0–100.0)
PLATELETS: 293 10*3/uL (ref 150–400)
RBC: 5.1 MIL/uL (ref 3.87–5.11)
RDW: 13.3 % (ref 11.5–15.5)
WBC: 5.7 10*3/uL (ref 4.0–10.5)

## 2015-04-21 LAB — LIPASE, BLOOD: Lipase: 17 U/L (ref 11–51)

## 2015-04-21 MED ORDER — ALPRAZOLAM 0.5 MG PO TABS
2.0000 mg | ORAL_TABLET | Freq: Once | ORAL | Status: AC
  Administered 2015-04-21: 2 mg via ORAL
  Filled 2015-04-21: qty 4

## 2015-04-21 MED ORDER — ONDANSETRON HCL 4 MG/2ML IJ SOLN: 4.0000 mg | Freq: Once | INTRAMUSCULAR | Status: DC

## 2015-04-21 MED ORDER — ALPRAZOLAM 2 MG PO TABS: 1.0000 mg | ORAL_TABLET | Freq: Two times a day (BID) | ORAL | Status: DC

## 2015-04-21 MED ORDER — SODIUM CHLORIDE 0.9 % IV BOLUS (SEPSIS): 1000.0000 mL | Freq: Once | INTRAVENOUS | Status: DC

## 2015-04-21 MED ORDER — HYDROMORPHONE HCL 1 MG/ML IJ SOLN: 1.0000 mg | Freq: Once | INTRAMUSCULAR | Status: DC

## 2015-04-21 NOTE — Discharge Instructions (Signed)
Return to the ED with any concerns including vomiting and not able to keep down liquids, fever/chills, worsening abdominal pain not controlled by medications, thoughts or feelings of suicide, decreased level of alertness/lethargy, or any other alarming symptoms

## 2015-04-21 NOTE — ED Notes (Signed)
Pt reports hasnt taken her xanax in 3-4 days and she has been taking 2mg  BID for 7 years.  Pt is anxious and having tremors.  Family states "i think she is withdrawing from her xanax."

## 2015-04-21 NOTE — ED Notes (Addendum)
Pt c/o abd pain . Seen here x 2 days ago for same. DX gallbladder didn't follow up with Surgeon. Pt states the pain gives here an anxiety attack and she is out of xanax

## 2015-04-21 NOTE — ED Provider Notes (Signed)
CSN: [id number]     Arrival date & time 04/21/15  1553 History   First MD Initiated Contact with Patient 04/21/15 1702     Chief Complaint  Patient presents with  . Abdominal Pain     (Consider location/radiation/quality/duration/timing/severity/associated sxs/prior Treatment) HPI  Pt presenting with c/o anxiety and tremors.  She was seen in the ED 2 days ago for abdominal pain and diagnosed with gallstones.  She continues to have upper abdominal pain but states that the pain is helped with the pain meds and nausea meds she was prescribed.  Pt states she has been out of her xanax for 3-4 days and has been on it for 7 years.  She tried calling her PMD and they are not open over the holidays.  No suicidal ideations.  No seizure activity. There are no other associated systemic symptoms, there are no other alleviating or modifying factors.   Past Medical History  Diagnosis Date  . Hypertension   . Graves disease   . Degenerative cervical disc   . Arrhythmia   . Degenerative disc disease, lumbar    Past Surgical History  Procedure Laterality Date  . Cardiac surgery    . Abdominal hysterectomy     History reviewed. No pertinent family history. Social History  Substance Use Topics  . Smoking status: Current Every Day Smoker  . Smokeless tobacco: None  . Alcohol Use: Yes     Comment: occasional   OB History    No data available     Review of Systems  ROS reviewed and all otherwise negative except for mentioned in HPI    Allergies  Review of patient's allergies indicates no known allergies.  Home Medications   Prior to Admission medications   Medication Sig Start Date End Date Taking? Authorizing Provider  ALPRAZolam Prudy Feeler(XANAX) 2 MG tablet Take 0.5 tablets (1 mg total) by mouth 2 (two) times daily. 04/21/15   Jerelyn ScottMartha Linker, MD  cyclobenzaprine (FLEXERIL) 10 MG tablet Take 1 tablet (10 mg total) by mouth 2 (two) times daily as needed for muscle spasms. 11/06/14   Nicole  Pisciotta, PA-C  GABAPENTIN PO Take by mouth.    Historical Provider, MD  LISINOPRIL PO Take by mouth.    Historical Provider, MD  mupirocin cream (BACTROBAN) 2 % Apply 1 application topically 2 (two) times daily. 01/27/15   April Palumbo, MD  ondansetron (ZOFRAN ODT) 4 MG disintegrating tablet Take 1 tablet (4 mg total) by mouth every 8 (eight) hours as needed for nausea or vomiting. 04/19/15   Samantha Tripp Dowless, PA-C  OxyMORphone HCl (OPANA PO) Take by mouth.    Historical Provider, MD   BP 122/96 mmHg  Pulse 93  Temp(Src) 98.2 F (36.8 C) (Oral)  Resp 18  Ht 5\' 7"  (1.702 m)  Wt 245 lb (111.131 kg)  BMI 38.36 kg/m2  SpO2 100%  Vitals reviewed Physical Exam  Physical Examination: General appearance - alert, well appearing, and in no distress Mental status - alert, oriented to person, place, and time Eyes - no conjunctival injection, no scleral icterus Mouth - mucous membranes moist, pharynx normal without lesions Chest - clear to auscultation, no wheezes, rales or rhonchi, symmetric air entry Heart - normal rate, regular rhythm, normal S1, S2, no murmurs, rubs, clicks or gallops Abdomen - soft, mild epigastric tenderness to palpation, no gaurding or rebound, nondistended, no masses or organomegaly Neurological - alert, oriented, normal speech, no focal findings or movement disorder noted Extremities - peripheral pulses normal,  no pedal edema, no clubbing or cyanosis Skin - normal coloration and turgor, no rashes Psych- anxious appearing, cooperative  ED Course  Procedures (including critical care time) Labs Review Labs Reviewed  COMPREHENSIVE METABOLIC PANEL - Abnormal; Notable for the following:    Glucose, Bld 124 (*)    Calcium 8.8 (*)    AST 14 (*)    ALT 8 (*)    All other components within normal limits  LIPASE, BLOOD  CBC    Imaging Review No results found. I have personally reviewed and evaluated these images and lab results as part of my medical  decision-making.   EKG Interpretation None      MDM   Final diagnoses:  Biliary colic  Anxiety    Pt presenting with ongoing biliary colic- her labs are unchanged from prior- she plans to arrange for followup with surgery as advised.  She also is experiencing withdrawal from xanax- she has been on this chronically for 7 years- rx given for her usual dose and she was advised to f/u with her regular doctor for further refills.  Discharged with strict return precautions.  Pt agreeable with plan.    Jerelyn Scott, MD 04/22/15 [phone]

## 2015-12-09 ENCOUNTER — Emergency Department (HOSPITAL_BASED_OUTPATIENT_CLINIC_OR_DEPARTMENT_OTHER)
Admission: EM | Admit: 2015-12-09 | Discharge: 2015-12-09 | Disposition: A | Discharge status: Home / Self Care | Payer: Self-pay | Attending: Emergency Medicine | Admitting: Emergency Medicine

## 2015-12-09 ENCOUNTER — Emergency Department (HOSPITAL_BASED_OUTPATIENT_CLINIC_OR_DEPARTMENT_OTHER): Payer: Self-pay

## 2015-12-09 ENCOUNTER — Encounter (HOSPITAL_BASED_OUTPATIENT_CLINIC_OR_DEPARTMENT_OTHER): Payer: Self-pay | Admitting: *Deleted

## 2015-12-09 DIAGNOSIS — F17210 Nicotine dependence, cigarettes, uncomplicated: Secondary | ICD-10-CM | POA: Insufficient documentation

## 2015-12-09 DIAGNOSIS — R300 Dysuria: Secondary | ICD-10-CM | POA: Insufficient documentation

## 2015-12-09 DIAGNOSIS — R3129 Other microscopic hematuria: Secondary | ICD-10-CM | POA: Insufficient documentation

## 2015-12-09 DIAGNOSIS — Z79899 Other long term (current) drug therapy: Secondary | ICD-10-CM | POA: Insufficient documentation

## 2015-12-09 DIAGNOSIS — I10 Essential (primary) hypertension: Secondary | ICD-10-CM | POA: Insufficient documentation

## 2015-12-09 DIAGNOSIS — R1011 Right upper quadrant pain: Secondary | ICD-10-CM | POA: Insufficient documentation

## 2015-12-09 LAB — URINALYSIS, ROUTINE W REFLEX MICROSCOPIC
BILIRUBIN URINE: NEGATIVE
Glucose, UA: NEGATIVE mg/dL
Ketones, ur: NEGATIVE mg/dL
LEUKOCYTES UA: NEGATIVE
NITRITE: NEGATIVE
PH: 7 (ref 5.0–8.0)
Protein, ur: NEGATIVE mg/dL
SPECIFIC GRAVITY, URINE: 1.031 — AB (ref 1.005–1.030)

## 2015-12-09 LAB — COMPREHENSIVE METABOLIC PANEL
ALBUMIN: 3.9 g/dL (ref 3.5–5.0)
ALT: 14 U/L (ref 14–54)
ANION GAP: 7 (ref 5–15)
AST: 20 U/L (ref 15–41)
Alkaline Phosphatase: 87 U/L (ref 38–126)
BUN: 8 mg/dL (ref 6–20)
CO2: 25 mmol/L (ref 22–32)
Calcium: 8.9 mg/dL (ref 8.9–10.3)
Chloride: 104 mmol/L (ref 101–111)
Creatinine, Ser: 0.59 mg/dL (ref 0.44–1.00)
GFR calc non Af Amer: >60 mL/min (ref >60)
Glucose, Bld: 120 mg/dL — ABNORMAL HIGH (ref 65–99)
POTASSIUM: 3.7 mmol/L (ref 3.5–5.1)
SODIUM: 136 mmol/L (ref 135–145)
Total Bilirubin: 0.4 mg/dL (ref 0.3–1.2)
Total Protein: 7.7 g/dL (ref 6.5–8.1)

## 2015-12-09 LAB — CBC WITH DIFFERENTIAL/PLATELET
BASOS ABS: 0 10*3/uL (ref 0.0–0.1)
BASOS PCT: 1 %
EOS ABS: 0.2 10*3/uL (ref 0.0–0.7)
Eosinophils Relative: 3 %
HCT: 40.8 % (ref 36.0–46.0)
HEMOGLOBIN: 13.2 g/dL (ref 12.0–15.0)
Lymphocytes Relative: 42 %
Lymphs Abs: 3.4 10*3/uL (ref 0.7–4.0)
MCH: 27.2 pg (ref 26.0–34.0)
MCHC: 32.4 g/dL (ref 30.0–36.0)
MCV: 84.1 fL (ref 78.0–100.0)
Monocytes Absolute: 0.5 10*3/uL (ref 0.1–1.0)
Monocytes Relative: 6 %
NEUTROS PCT: 48 %
Neutro Abs: 3.9 10*3/uL (ref 1.7–7.7)
Platelets: 251 10*3/uL (ref 150–400)
RBC: 4.85 MIL/uL (ref 3.87–5.11)
RDW: 13.3 % (ref 11.5–15.5)
WBC: 8 10*3/uL (ref 4.0–10.5)

## 2015-12-09 LAB — URINE MICROSCOPIC-ADD ON

## 2015-12-09 LAB — LIPASE, BLOOD: LIPASE: 16 U/L (ref 11–51)

## 2015-12-09 MED ORDER — IOPAMIDOL (ISOVUE-300) INJECTION 61%
100.0000 mL | Freq: Once | INTRAVENOUS | Status: AC | PRN
  Administered 2015-12-09: 100 mL via INTRAVENOUS

## 2015-12-09 MED ORDER — SODIUM CHLORIDE 0.9 % IV SOLN
Freq: Once | INTRAVENOUS | Status: AC
  Administered 2015-12-09: 03:00:00 via INTRAVENOUS

## 2015-12-09 NOTE — ED Triage Notes (Signed)
Pt reports urinary retention and abdominal distension x 1 day.

## 2015-12-09 NOTE — ED Provider Notes (Signed)
[facility] Note   CSN: [id number] Arrival date & time: 12/09/15  0111  First Provider Contact:  None       History   Chief Complaint Chief Complaint  Patient presents with  . Urinary Retention    HPI Danielle Fernandez is a 45 y.o. female who was on large doses of narcotics for chronic back pain. She is here with a 2 day history of urinary urgency, urinary frequency and voiding small amounts. She has the sense that she is not emptying her bladder. She has had associated chills but no fever. She is not having pain with voiding.  She is also complaining of a one-week history of periumbilical pain and right upper quadrant pain which is reminiscent of the pain she had with her gallbladder prior to her cholecystectomy. She also has a sensation that her abdomen is distended. Symptoms are moderate. She had some transient nausea earlier yesterday evening but no vomiting or diarrhea.  HPI  Past Medical History:  Diagnosis Date  . Arrhythmia   . Degenerative cervical disc   . Degenerative disc disease, lumbar   . Graves disease   . Hypertension     There are no active problems to display for this patient.   Past Surgical History:  Procedure Laterality Date  . ABDOMINAL HYSTERECTOMY    . CARDIAC SURGERY    . CHOLECYSTECTOMY      OB History    No data available       Home Medications    Prior to Admission medications   Medication Sig Start Date End Date Taking? Authorizing Provider  OXYCODONE-ACETAMINOPHEN PO Take 1 tablet by mouth QID.   Yes Historical Provider, MD  cyclobenzaprine (FLEXERIL) 10 MG tablet Take 1 tablet (10 mg total) by mouth 2 (two) times daily as needed for muscle spasms. 11/06/14   Nicole Pisciotta, PA-C  GABAPENTIN PO Take by mouth.    Historical Provider, MD  LISINOPRIL PO Take by mouth.    Historical Provider, MD  OxyMORphone HCl (OPANA PO) Take by mouth.    Historical Provider, MD    Family History History reviewed. No pertinent  family history.  Social History Social History  Substance Use Topics  . Smoking status: Current Every Day Smoker    Packs/day: 0.50    Types: Cigarettes  . Smokeless tobacco: Never Used  . Alcohol use Yes     Comment: occasional     Allergies   Review of patient's allergies indicates no known allergies.   Review of Systems Review of Systems  All other systems reviewed and are negative.    Physical Exam Updated Vital Signs BP 153/100 (BP Location: Right Arm)   Pulse 99   Temp 98.2 F (36.8 C) (Oral)   Resp 18   Ht 5\' 7"  (1.702 m)   Wt 260 lb (117.9 kg)   SpO2 99%   BMI 40.72 kg/m   Physical Exam General: Well-developed, well-nourished female in no acute distress; appearance consistent with age of record HENT: normocephalic; atraumatic Eyes: pupils equal, round and reactive to light; extraocular muscles intact Neck: supple Heart: regular rate and rhythm Lungs: clear to auscultation bilaterally Abdomen: soft; nondistended; periumbilical and right upper quadrant tenderness; no masses or hepatosplenomegaly; bowel sounds present GU: No CVA tenderness Extremities: No deformity; full range of motion; pulses normal Neurologic: Awake, alert and oriented; motor function intact in all extremities and symmetric; no facial droop Skin: Warm and dry Psychiatric: Normal mood and affect  ED Treatments / Results   Nursing notes and vitals signs, including pulse oximetry, reviewed.  Summary of this visit's results, reviewed by myself:  Labs:  Results for orders placed or performed during the hospital encounter of 12/09/15 (from the past 24 hour(s))  Urinalysis, Routine w reflex microscopic (not at [facility])     Status: Abnormal   Collection Time: 12/09/15  1:30 AM  Result Value Ref Range   Color, Urine YELLOW YELLOW   APPearance CLOUDY (A) CLEAR   Specific Gravity, Urine 1.031 (H) 1.005 - 1.030   pH 7.0 5.0 - 8.0   Glucose, UA NEGATIVE NEGATIVE mg/dL   Hgb urine  dipstick SMALL (A) NEGATIVE   Bilirubin Urine NEGATIVE NEGATIVE   Ketones, ur NEGATIVE NEGATIVE mg/dL   Protein, ur NEGATIVE NEGATIVE mg/dL   Nitrite NEGATIVE NEGATIVE   Leukocytes, UA NEGATIVE NEGATIVE  Urine microscopic-add on     Status: Abnormal   Collection Time: 12/09/15  1:30 AM  Result Value Ref Range   Squamous Epithelial / LPF 6-30 (A) NONE SEEN   WBC, UA 0-5 0 - 5 WBC/hpf   RBC / HPF 6-30 0 - 5 RBC/hpf   Bacteria, UA MANY (A) NONE SEEN   Urine-Other MUCOUS PRESENT   CBC with Differential/Platelet     Status: None   Collection Time: 12/09/15  2:35 AM  Result Value Ref Range   WBC 8.0 4.0 - 10.5 K/uL   RBC 4.85 3.87 - 5.11 MIL/uL   Hemoglobin 13.2 12.0 - 15.0 g/dL   HCT 40.9 81.1 - 91.4 %   MCV 84.1 78.0 - 100.0 fL   MCH 27.2 26.0 - 34.0 pg   MCHC 32.4 30.0 - 36.0 g/dL   RDW 78.2 95.6 - 21.3 %   Platelets 251 150 - 400 K/uL   Neutrophils Relative % 48 %   Neutro Abs 3.9 1.7 - 7.7 K/uL   Lymphocytes Relative 42 %   Lymphs Abs 3.4 0.7 - 4.0 K/uL   Monocytes Relative 6 %   Monocytes Absolute 0.5 0.1 - 1.0 K/uL   Eosinophils Relative 3 %   Eosinophils Absolute 0.2 0.0 - 0.7 K/uL   Basophils Relative 1 %   Basophils Absolute 0.0 0.0 - 0.1 K/uL  Lipase, blood     Status: None   Collection Time: 12/09/15  2:35 AM  Result Value Ref Range   Lipase 16 11 - 51 U/L  Comprehensive metabolic panel     Status: Abnormal   Collection Time: 12/09/15  2:35 AM  Result Value Ref Range   Sodium 136 135 - 145 mmol/L   Potassium 3.7 3.5 - 5.1 mmol/L   Chloride 104 101 - 111 mmol/L   CO2 25 22 - 32 mmol/L   Glucose, Bld 120 (H) 65 - 99 mg/dL   BUN 8 6 - 20 mg/dL   Creatinine, Ser 0.86 0.44 - 1.00 mg/dL   Calcium 8.9 8.9 - 57.8 mg/dL   Total Protein 7.7 6.5 - 8.1 g/dL   Albumin 3.9 3.5 - 5.0 g/dL   AST 20 15 - 41 U/L   ALT 14 14 - 54 U/L   Alkaline Phosphatase 87 38 - 126 U/L   Total Bilirubin 0.4 0.3 - 1.2 mg/dL   GFR calc non Af Amer >60 >60 mL/min   GFR calc Af Amer >60  >60 mL/min   Anion gap 7 5 - 15    Imaging Studies: Ct Abdomen Pelvis W Contrast  Result Date: 12/09/2015 CLINICAL DATA:  Acute onset of urinary retention and abdominal distention. Initial encounter. EXAM: CT ABDOMEN AND PELVIS WITH CONTRAST TECHNIQUE: Multidetector CT imaging of the abdomen and pelvis was performed using the standard protocol following bolus administration of intravenous contrast. CONTRAST:  ISOVUE-300 IOPAMIDOL (ISOVUE-300) INJECTION 61% COMPARISON:  Abdominal ultrasound performed 04/19/2015 FINDINGS: The visualized lung bases are clear. The liver and spleen are unremarkable in appearance. The patient is status post cholecystectomy. The pancreas and adrenal glands are unremarkable. The kidneys are unremarkable in appearance. There is no evidence of hydronephrosis. No renal or ureteral stones are seen. No perinephric stranding is appreciated. No free fluid is identified. The small bowel is unremarkable in appearance. The stomach is within normal limits. No acute vascular abnormalities are seen. The appendix is normal in caliber, without evidence of appendicitis. The colon is largely decompressed and grossly unremarkable in appearance. The bladder is mildly distended and grossly unremarkable. The patient is status post hysterectomy. No suspicious adnexal masses are seen. The ovaries are grossly symmetric. No inguinal lymphadenopathy is seen. No acute osseous abnormalities are identified. IMPRESSION: No acute abnormality seen within the abdomen or pelvis. Electronically Signed   By: Roanna Raider M.D.   On: 12/09/2015 04:08   4:20 AM The patient was advised of CT and laboratory findings. The only significant concern is for microscopic hematuria. She was advised to follow-up with her primary care physician for further evaluation of this.  Procedures (including critical care time)   Final Clinical Impressions(s) / ED Diagnoses   Final diagnoses:  Dysuria  Microscopic hematuria       Paula Libra, MD 12/09/15 [phone]

## 2017-08-30 IMAGING — CT CT ABD-PELV W/ CM
2 of 5 series · 17 of 46 positions shown, 19 images · IV contrast (iopamidol)
Comparison: Abdominal ultrasound performed 04/19/2015

CLINICAL DATA: Acute onset of urinary retention and abdominal
distention. Initial encounter.

EXAM:
CT ABDOMEN AND PELVIS WITH CONTRAST
TECHNIQUE: Multidetector CT imaging of the abdomen and pelvis was performed
using the standard protocol following bolus administration of
intravenous contrast.
CONTRAST:  100mL 80RKQJ-MWW IOPAMIDOL (80RKQJ-MWW) INJECTION 61%

[Series 2: axial st · axial · 0.85mm/px · z∈[+302,+707]mm · 14 of 93 slices shown, 16 images]
[im 6/93  soft-tissue]
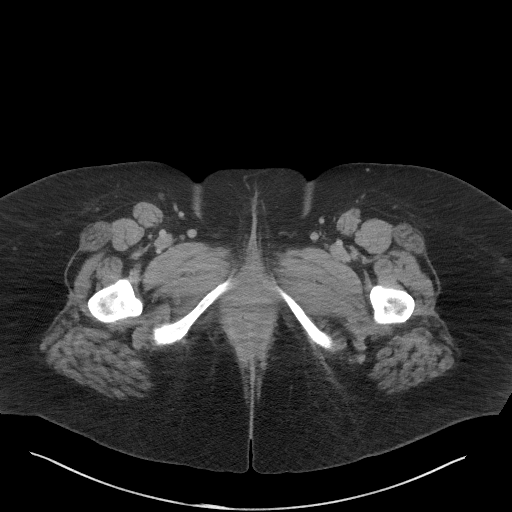
[im 6/93  bone]
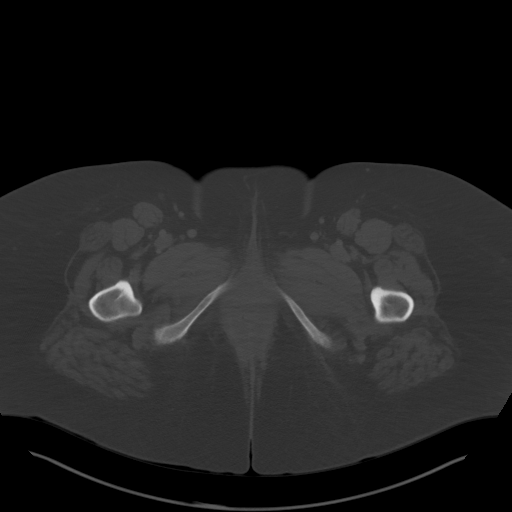
[im 11/93  soft-tissue]
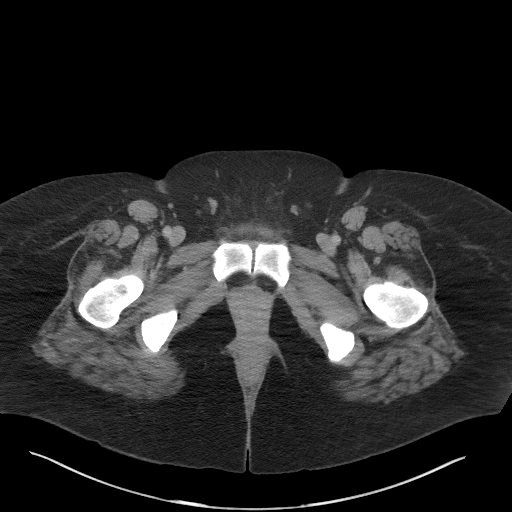
[im 21/93  soft-tissue]
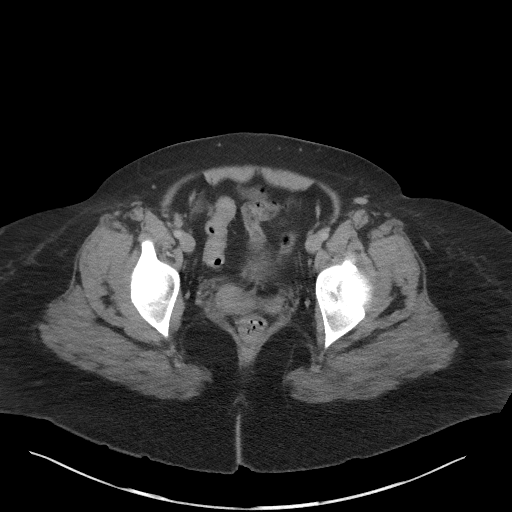
[im 26/93  soft-tissue]
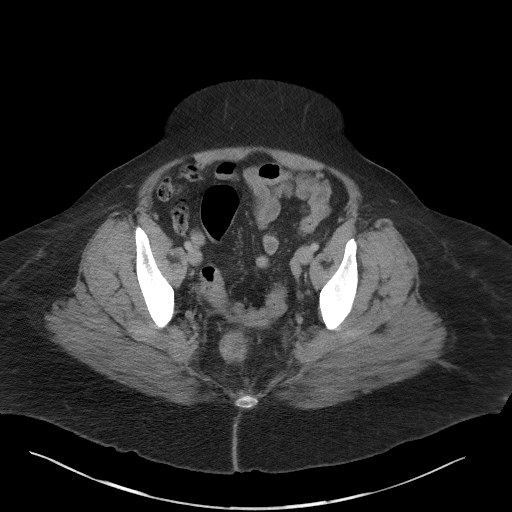
[im 31/93  soft-tissue]
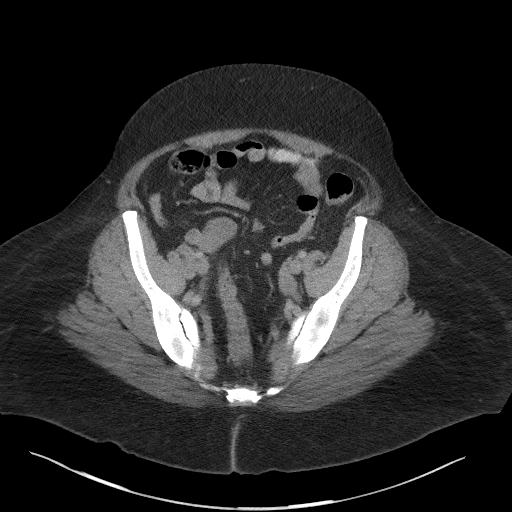
[im 36/93  soft-tissue]
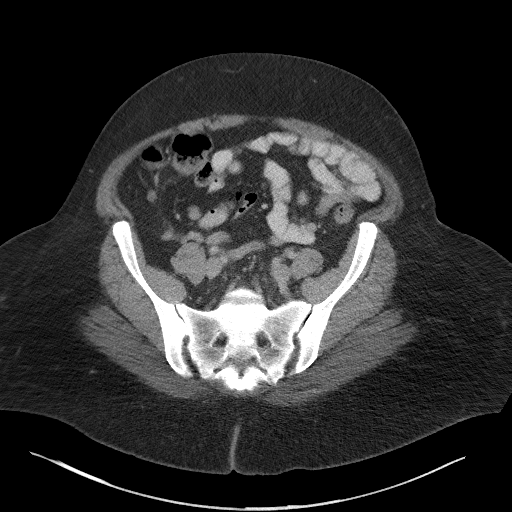
[im 41/93  soft-tissue]
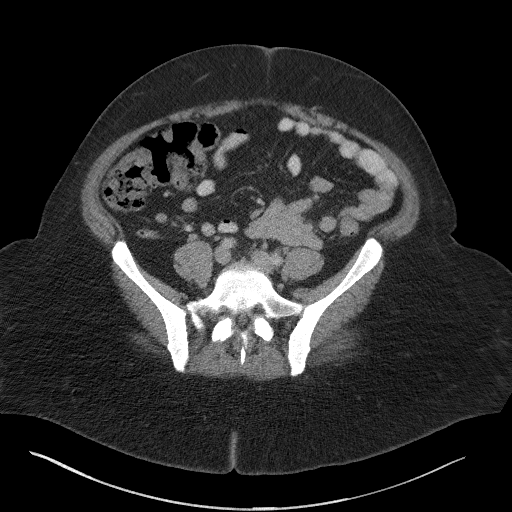
[im 52/93  soft-tissue]
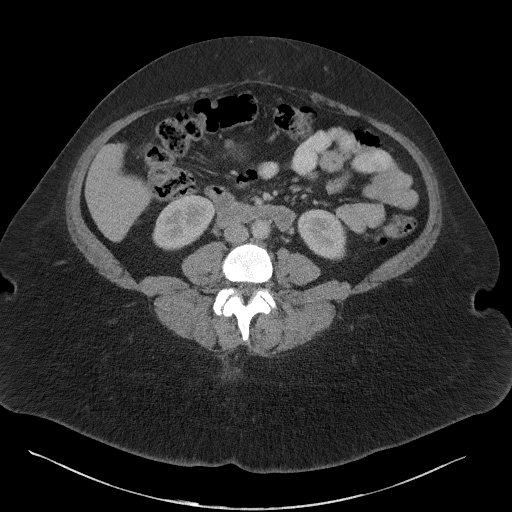
[im 57/93  soft-tissue]
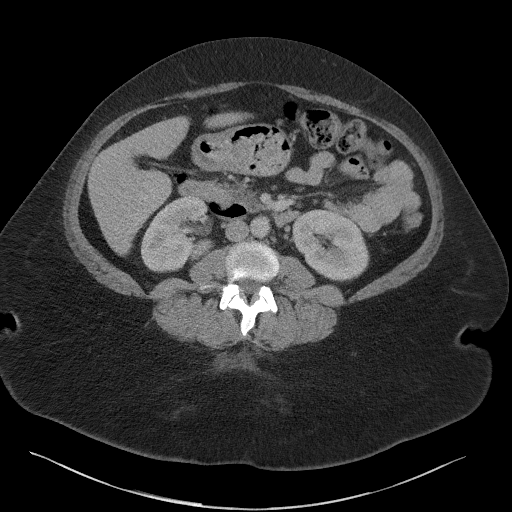
[im 57/93  bone]
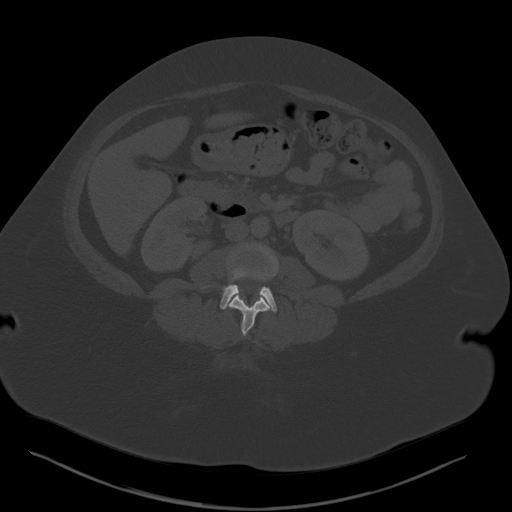
[im 62/93  soft-tissue]
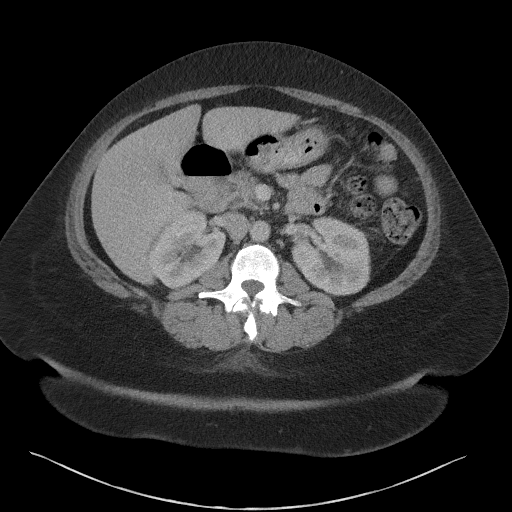
[im 67/93  soft-tissue]
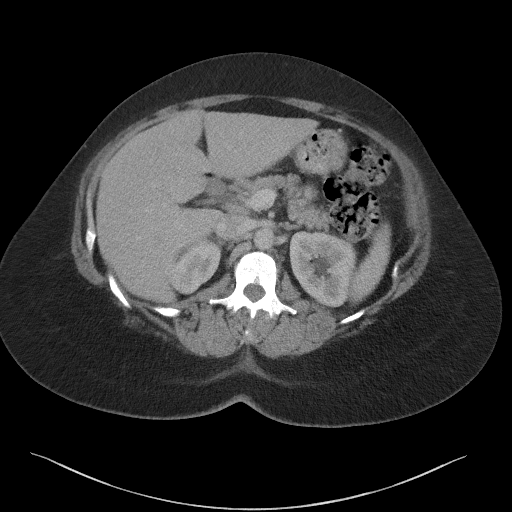
[im 72/93  soft-tissue]
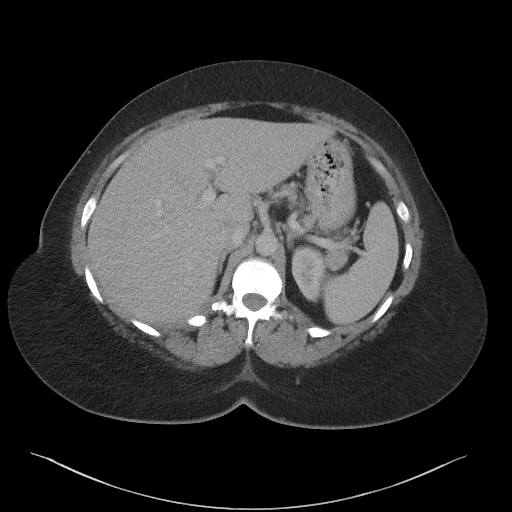
[im 82/93  soft-tissue]
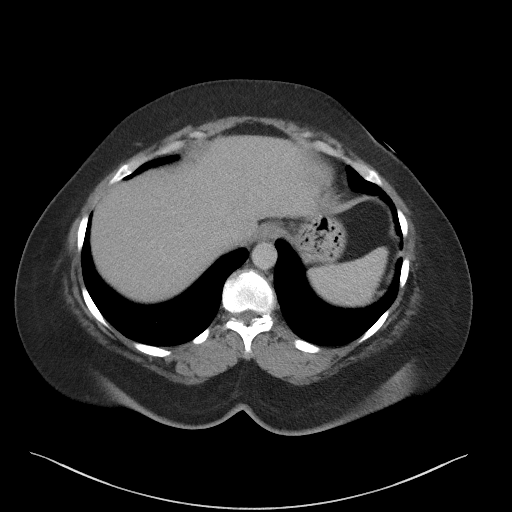
[im 87/93  soft-tissue]
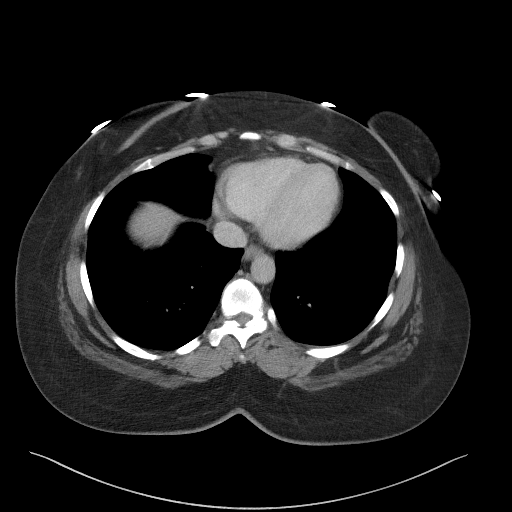

[Series 5: coronal st · coronal · 0.91mm/px · 3 of 120 slices shown]
[im 40/120  soft-tissue]
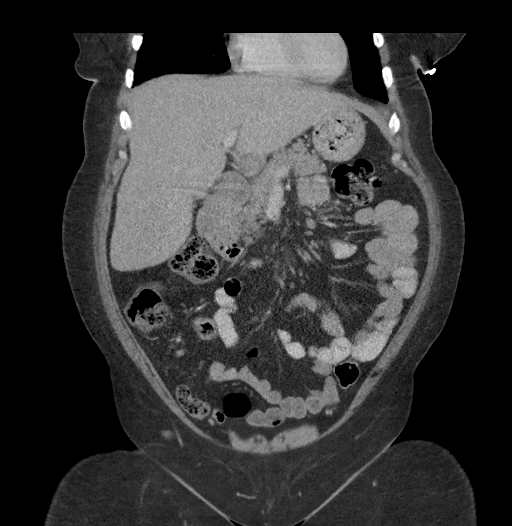
[im 53/120  soft-tissue]
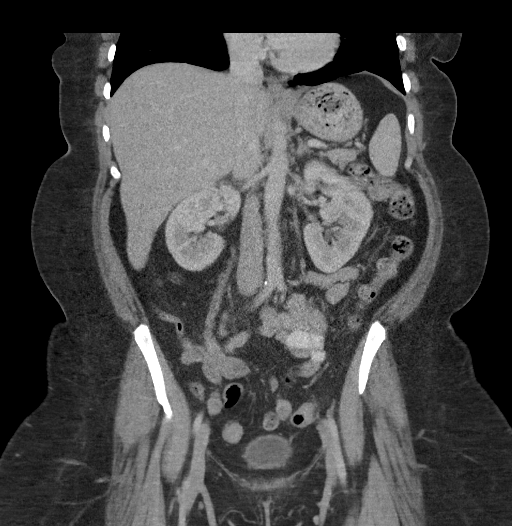
[im 67/120  soft-tissue]
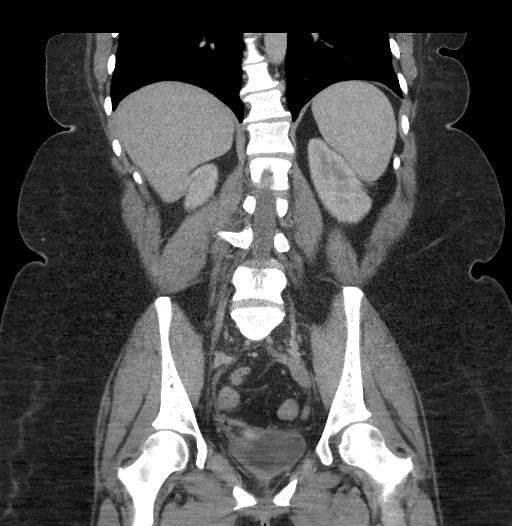

[17 of 46 positions shown; findings below may reference images not displayed]

FINDINGS: The visualized lung bases are clear.

The liver and spleen are unremarkable in appearance. The patient is
status post cholecystectomy. The pancreas and adrenal glands are
unremarkable.

The kidneys are unremarkable in appearance. There is no evidence of
hydronephrosis. No renal or ureteral stones are seen. No perinephric
stranding is appreciated.

No free fluid is identified. The small bowel is unremarkable in
appearance. The stomach is within normal limits. No acute vascular
abnormalities are seen.

The appendix is normal in caliber, without evidence of appendicitis.
The colon is largely decompressed and grossly unremarkable in
appearance.

The bladder is mildly distended and grossly unremarkable. The
patient is status post hysterectomy. No suspicious adnexal masses
are seen. The ovaries are grossly symmetric. No inguinal
lymphadenopathy is seen.

No acute osseous abnormalities are identified.
IMPRESSION: No acute abnormality seen within the abdomen or pelvis.

## 2019-08-14 ENCOUNTER — Ambulatory Visit: Payer: Self-pay

## 2019-09-07 ENCOUNTER — Ambulatory Visit: Payer: Medicaid Other

## 2023-03-28 ENCOUNTER — Emergency Department (HOSPITAL_BASED_OUTPATIENT_CLINIC_OR_DEPARTMENT_OTHER): Payer: Medicaid Other

## 2023-03-28 ENCOUNTER — Emergency Department (HOSPITAL_BASED_OUTPATIENT_CLINIC_OR_DEPARTMENT_OTHER)
Admission: EM | Admit: 2023-03-28 | Discharge: 2023-03-28 | Disposition: A | Discharge status: Home / Self Care | Payer: Medicaid Other | Attending: Emergency Medicine | Admitting: Emergency Medicine

## 2023-03-28 ENCOUNTER — Encounter (HOSPITAL_BASED_OUTPATIENT_CLINIC_OR_DEPARTMENT_OTHER): Payer: Self-pay | Admitting: Emergency Medicine

## 2023-03-28 ENCOUNTER — Other Ambulatory Visit: Payer: Self-pay

## 2023-03-28 DIAGNOSIS — Z79899 Other long term (current) drug therapy: Secondary | ICD-10-CM | POA: Diagnosis not present

## 2023-03-28 DIAGNOSIS — J069 Acute upper respiratory infection, unspecified: Secondary | ICD-10-CM | POA: Insufficient documentation

## 2023-03-28 DIAGNOSIS — R059 Cough, unspecified: Secondary | ICD-10-CM | POA: Diagnosis present

## 2023-03-28 DIAGNOSIS — Z20822 Contact with and (suspected) exposure to covid-19: Secondary | ICD-10-CM | POA: Insufficient documentation

## 2023-03-28 DIAGNOSIS — I10 Essential (primary) hypertension: Secondary | ICD-10-CM | POA: Insufficient documentation

## 2023-03-28 DIAGNOSIS — R079 Chest pain, unspecified: Secondary | ICD-10-CM | POA: Diagnosis not present

## 2023-03-28 HISTORY — DX: Type 2 diabetes mellitus without complications: E11.9

## 2023-03-28 HISTORY — DX: Autoimmune thyroiditis: E06.3

## 2023-03-28 LAB — CBC WITH DIFFERENTIAL/PLATELET
Abs Immature Granulocytes: 0.02 10*3/uL (ref 0.00–0.07)
Basophils Absolute: 0 10*3/uL (ref 0.0–0.1)
Basophils Relative: 1 %
Eosinophils Absolute: 0.3 10*3/uL (ref 0.0–0.5)
Eosinophils Relative: 5 %
HCT: 37.1 % (ref 36.0–46.0)
Hemoglobin: 11.7 g/dL — ABNORMAL LOW (ref 12.0–15.0)
Immature Granulocytes: 0 %
Lymphocytes Relative: 52 %
Lymphs Abs: 2.8 10*3/uL (ref 0.7–4.0)
MCH: 27.2 pg (ref 26.0–34.0)
MCHC: 31.5 g/dL (ref 30.0–36.0)
MCV: 86.3 fL (ref 80.0–100.0)
Monocytes Absolute: 0.3 10*3/uL (ref 0.1–1.0)
Monocytes Relative: 5 %
Neutro Abs: 2.1 10*3/uL (ref 1.7–7.7)
Neutrophils Relative %: 37 %
Platelets: 248 10*3/uL (ref 150–400)
RBC: 4.3 MIL/uL (ref 3.87–5.11)
RDW: 12.8 % (ref 11.5–15.5)
WBC: 5.5 10*3/uL (ref 4.0–10.5)
nRBC: 0 % (ref 0.0–0.2)

## 2023-03-28 LAB — HEPATIC FUNCTION PANEL
ALT: 14 U/L (ref 0–44)
AST: 13 U/L — ABNORMAL LOW (ref 15–41)
Albumin: 3.8 g/dL (ref 3.5–5.0)
Alkaline Phosphatase: 87 U/L (ref 38–126)
Bilirubin, Direct: <0.1 mg/dL (ref 0.0–0.2)
Total Bilirubin: 0.3 mg/dL (ref <1.2)
Total Protein: 7.5 g/dL (ref 6.5–8.1)

## 2023-03-28 LAB — I-STAT CHEM 8, ED
BUN: 17 mg/dL (ref 6–20)
Calcium, Ion: 1.2 mmol/L (ref 1.15–1.40)
Chloride: 102 mmol/L (ref 98–111)
Creatinine, Ser: 0.7 mg/dL (ref 0.44–1.00)
Glucose, Bld: 114 mg/dL — ABNORMAL HIGH (ref 70–99)
HCT: 35 % — ABNORMAL LOW (ref 36.0–46.0)
Hemoglobin: 11.9 g/dL — ABNORMAL LOW (ref 12.0–15.0)
Potassium: 3.5 mmol/L (ref 3.5–5.1)
Sodium: 141 mmol/L (ref 135–145)
TCO2: 28 mmol/L (ref 22–32)

## 2023-03-28 LAB — RESP PANEL BY RT-PCR (RSV, FLU A&B, COVID)  RVPGX2
Influenza A by PCR: NEGATIVE
Influenza B by PCR: NEGATIVE
Resp Syncytial Virus by PCR: NEGATIVE
SARS Coronavirus 2 by RT PCR: NEGATIVE

## 2023-03-28 LAB — TROPONIN I (HIGH SENSITIVITY)
Troponin I (High Sensitivity): <2 ng/L (ref <18)
Troponin I (High Sensitivity): <2 ng/L (ref <18)

## 2023-03-28 MED ORDER — IOHEXOL 350 MG/ML SOLN
100.0000 mL | Freq: Once | INTRAVENOUS | Status: AC | PRN
  Administered 2023-03-28: 100 mL via INTRAVENOUS

## 2023-03-28 MED ORDER — BENZONATATE 100 MG PO CAPS: 100.0000 mg | ORAL_CAPSULE | Freq: Three times a day (TID) | ORAL | 0 refills | Status: AC

## 2023-03-28 NOTE — ED Triage Notes (Signed)
States has been having chest pain for the past 2-3 days and has had cold symptoms x 1 month

## 2023-03-28 NOTE — ED Notes (Signed)
Fall risk armband Fall risk sign on door Fall risk socks

## 2023-03-28 NOTE — Discharge Instructions (Signed)
You were seen in the ER with concerns of a cough. Your labs, imaging, and EKG were all reassuring.  There is no signs of heart attack, blood clot in the lungs, or pneumonia. I suspect you likely have a virus that is causing a linger cough unfortunately. I would suggest following up with your primary care provider or returning to the ER if symptoms are worsening or new symptoms arise. I have sent a prescription fro tessalon to your pharmacy for cough.

## 2023-03-28 NOTE — ED Provider Notes (Signed)
[place] EMERGENCY DEPARTMENT AT [facility] Provider Note   CSN: [id number] Arrival date & time: 03/28/23  1358     History Chief Complaint  Patient presents with   Chest Pain    Danielle Fernandez is a 52 y.o. female.  Patient with past history significant for recently diagnosed Hashimoto's thyroiditis, hypertension, and arrhythmia presents to the ER with concerns of chest pain.  Reports that over the last 2 to 3 days she has had some chest pain primary on the left side without radiation.  States that she has also had cold type symptoms for the last month with coughing, some chills, and general fatigue.  The fatigue she believes is likely due to her thyroiditis although she does report that this is improving after starting medications.  Unsure of any recent sick contacts and states that she has tested herself for COVID a few times and has been negative.  Denies any sore throat, abdominal pain, nausea, vomiting, or diarrhea.  No diaphoresis.  Denies any exertional dyspnea or exertional chest pain.  Appears that chest pain is typically aggravated by coughing.   Chest Pain      Home Medications Prior to Admission medications   Medication Sig Start Date End Date Taking? Authorizing Provider  benzonatate (TESSALON) 100 MG capsule Take 1 capsule (100 mg total) by mouth every 8 (eight) hours. 03/28/23  Yes Smitty Knudsen, PA-C  cyclobenzaprine (FLEXERIL) 10 MG tablet Take 1 tablet (10 mg total) by mouth 2 (two) times daily as needed for muscle spasms. 11/06/14   Pisciotta, Joni Reining, PA-C  GABAPENTIN PO Take by mouth.    [provider]  LISINOPRIL PO Take by mouth.    [provider]  OXYCODONE-ACETAMINOPHEN PO Take 1 tablet by mouth QID.    [provider]  OxyMORphone HCl (OPANA PO) Take by mouth.    [provider]      Allergies    Metformin and Shellfish allergy    Review of Systems   Review of Systems  Cardiovascular:  Positive for chest  pain.  All other systems reviewed and are negative.   Physical Exam Updated Vital Signs BP 106/74   Pulse 88   Temp 98.4 F (36.9 C)   Resp 14   Ht 5' 7.5" (1.715 m)   Wt 132.4 kg   SpO2 97%   BMI 45.04 kg/m  Physical Exam Vitals and nursing note reviewed.  Constitutional:      General: She is not in acute distress.    Appearance: She is well-developed.  HENT:     Head: Normocephalic and atraumatic.  Eyes:     Conjunctiva/sclera: Conjunctivae normal.  Cardiovascular:     Rate and Rhythm: Normal rate and regular rhythm.     Heart sounds: No murmur heard. Pulmonary:     Effort: Pulmonary effort is normal. No respiratory distress.     Breath sounds: Normal breath sounds. No decreased breath sounds, wheezing or rhonchi.  Abdominal:     Palpations: Abdomen is soft.     Tenderness: There is no abdominal tenderness.  Musculoskeletal:        General: No swelling.     Cervical back: Neck supple.  Skin:    General: Skin is warm and dry.     Capillary Refill: Capillary refill takes less than 2 seconds.  Neurological:     Mental Status: She is alert.  Psychiatric:        Mood and Affect: Mood normal.  ED Results / Procedures / Treatments   Labs (all labs ordered are listed, but only abnormal results are displayed) Labs Reviewed  CBC WITH DIFFERENTIAL/PLATELET - Abnormal; Notable for the following components:      Result Value   Hemoglobin 11.7 (*)    All other components within normal limits  HEPATIC FUNCTION PANEL - Abnormal; Notable for the following components:   AST 13 (*)    All other components within normal limits  I-STAT CHEM 8, ED - Abnormal; Notable for the following components:   Glucose, Bld 114 (*)    Hemoglobin 11.9 (*)    HCT 35.0 (*)    All other components within normal limits  RESP PANEL BY RT-PCR (RSV, FLU A&B, COVID)  RVPGX2  TROPONIN I (HIGH SENSITIVITY)  TROPONIN I (HIGH SENSITIVITY)    EKG None  Radiology CT Angio Chest PE W and/or  Wo Contrast  Result Date: 03/28/2023 CLINICAL DATA:  Several day history of chest pain. One-month of upper respiratory symptoms. EXAM: CT ANGIOGRAPHY CHEST WITH CONTRAST TECHNIQUE: Multidetector CT imaging of the chest was performed using the standard protocol during bolus administration of intravenous contrast. Multiplanar CT image reconstructions and MIPs were obtained to evaluate the vascular anatomy. RADIATION DOSE REDUCTION: This exam was performed according to the departmental dose-optimization program which includes automated exposure control, adjustment of the mA and/or kV according to patient size and/or use of iterative reconstruction technique. CONTRAST:  OMNIPAQUE IOHEXOL 350 MG/ML SOLN COMPARISON:  Same day chest radiograph. FINDINGS: Cardiovascular: The study is high quality for the evaluation of pulmonary embolism. There are no filling defects in the central, lobar, segmental or subsegmental pulmonary artery branches to suggest acute pulmonary embolism. Great vessels are normal in course and caliber. Normal heart size. No significant pericardial fluid/thickening. Mediastinum/Nodes: Imaged thyroid gland without nodules meeting criteria for imaging follow-up by size. Normal esophagus. No pathologically enlarged axillary, supraclavicular, mediastinal, or hilar lymph nodes. Lungs/Pleura: The central airways are patent. Lobe predominant mild centrilobular emphysema. 4 mm subpleural right apical ground-glass nodule (303:32). 3 mm triangular left upper lobe perifissural nodule (303:47), likely lymph node. Left upper lobe calcified granuloma. No focal consolidation. No pneumothorax. No pleural effusion. Upper abdomen: Normal. Musculoskeletal: No acute or abnormal lytic or blastic osseous lesions. Multilevel degenerative changes of the thoracic spine. Review of the MIP images confirms the above findings. IMPRESSION: 1. No evidence of pulmonary embolism or other acute intrathoracic process. 2. A 4 mm  subpleural right apical ground-glass nodule. No follow-up recommended. This recommendation follows the consensus statement: Guidelines for Management of Incidental Pulmonary Nodules Detected on CT Images: From the Fleischner Society 2017; Radiology 2017; 284:228-243. 3.  Emphysema (ICD10-J43.9). Electronically Signed   By: Agustin Cree M.D.   On: 03/28/2023 17:22   DG Chest 2 View  Result Date: 03/28/2023 CLINICAL DATA:  Chest pain. EXAM: CHEST - 2 VIEW COMPARISON:  07/31/2010. FINDINGS: Bilateral lung fields are clear. Bilateral costophrenic angles are clear. Stable cardio-mediastinal silhouette. No acute osseous abnormalities. The soft tissues are within normal limits. IMPRESSION: *No active cardiopulmonary disease. Electronically Signed   By: Jules Schick M.D.   On: 03/28/2023 15:37    Procedures Procedures   Medications Ordered in ED Medications  iohexol (OMNIPAQUE) 350 MG/ML injection 100 mL (100 mLs Intravenous Contrast Given 03/28/23 1656)    ED Course/ Medical Decision Making/ A&P  Medical Decision Making Amount and/or Complexity of Data Reviewed Labs: ordered. Radiology: ordered.  Risk Prescription drug management.   This patient presents to the ED for concern of chest pain.  Differential diagnosis includes ACS, pneumonia, bronchitis, COVID-19   Lab Tests:  I Ordered, and personally interpreted labs.  The pertinent results include: CBC unremarkable, i-STAT Chem-8 with any significant findings, hepatic function panel unremarkable, troponin less than 2, respiratory panel negative for COVID-19, influenza, RSV   Imaging Studies ordered:  I ordered imaging studies including chest x-ray I independently visualized and interpreted imaging which showed no acute cardiopulmonary process, no evidence of PE I agree with the radiologist interpretation   Problem List / ED Course:  Patient with past history significant for Hashimoto's thyroiditis,  hypertension, and heart arrhythmia presents to the emergency department concerns of chest pain.  Endorses that this chest pain has been present for the last 2 to 3 days and is concerning for possible pneumonia she has had cold type symptoms for the last month.  Denies any recent fevers but does have some chills and some bodyaches although minimal.  Not taking medications and not been evaluated for this recently.  Denies any shortness of breath, exertional chest pain, nausea, vomiting, or diaphoresis.  Cardiac workup initiated including EGD, chest x-ray, troponin and basic labs.  Doubt true ACS at this time so will hold off on aspirin. Patient's labs and imaging are unremarkable at this time.  Does not appear to be ACS related.  Chest x-ray is also unremarkable at this time no evidence of pneumonia.  CT angio chest ordered for further evaluation given that patient cannot be ruled out given PERC criteria.  Patient agreed with this plan. CT angio chest is negative for any findings suggesting PE.  No evidence of pneumonia on CT either.  Suspect patient likely has either acquired a new viral process causing her symptoms or is possibly dealing with postviral cough.  A prescription for Tessalon sent to patient's pharmacy for attempted treatment of cough. No other focal concerns at this time. Strict return precautions discussed. Discharged home in stable condition.  Final Clinical Impression(s) / ED Diagnoses Final diagnoses:  Viral URI with cough    Rx / DC Orders ED Discharge Orders          Ordered    benzonatate (TESSALON) 100 MG capsule  Every 8 hours        03/28/23 1808              Smitty Knudsen, PA-C 03/28/23 2224    Rolan Bucco, MD 03/29/23 [phone]

## 2023-06-19 ENCOUNTER — Encounter (HOSPITAL_COMMUNITY): Payer: Self-pay | Admitting: Emergency Medicine

## 2023-06-19 ENCOUNTER — Emergency Department (HOSPITAL_COMMUNITY): Payer: Medicaid Other

## 2023-06-19 ENCOUNTER — Emergency Department (HOSPITAL_COMMUNITY)
Admission: EM | Admit: 2023-06-19 | Discharge: 2023-06-20 | Disposition: A | Discharge status: Home / Self Care | Payer: Medicaid Other | Attending: Emergency Medicine | Admitting: Emergency Medicine

## 2023-06-19 DIAGNOSIS — E119 Type 2 diabetes mellitus without complications: Secondary | ICD-10-CM | POA: Diagnosis not present

## 2023-06-19 DIAGNOSIS — Z87891 Personal history of nicotine dependence: Secondary | ICD-10-CM | POA: Insufficient documentation

## 2023-06-19 DIAGNOSIS — M25562 Pain in left knee: Secondary | ICD-10-CM | POA: Diagnosis not present

## 2023-06-19 DIAGNOSIS — Y92009 Unspecified place in unspecified non-institutional (private) residence as the place of occurrence of the external cause: Secondary | ICD-10-CM | POA: Insufficient documentation

## 2023-06-19 DIAGNOSIS — E039 Hypothyroidism, unspecified: Secondary | ICD-10-CM | POA: Insufficient documentation

## 2023-06-19 DIAGNOSIS — I10 Essential (primary) hypertension: Secondary | ICD-10-CM | POA: Diagnosis not present

## 2023-06-19 DIAGNOSIS — Y9301 Activity, walking, marching and hiking: Secondary | ICD-10-CM | POA: Diagnosis not present

## 2023-06-19 DIAGNOSIS — S0990XA Unspecified injury of head, initial encounter: Secondary | ICD-10-CM | POA: Diagnosis present

## 2023-06-19 DIAGNOSIS — W19XXXA Unspecified fall, initial encounter: Secondary | ICD-10-CM

## 2023-06-19 DIAGNOSIS — W108XXA Fall (on) (from) other stairs and steps, initial encounter: Secondary | ICD-10-CM | POA: Insufficient documentation

## 2023-06-19 LAB — CBC
HCT: 42.4 % (ref 36.0–46.0)
Hemoglobin: 13.3 g/dL (ref 12.0–15.0)
MCH: 26.7 pg (ref 26.0–34.0)
MCHC: 31.4 g/dL (ref 30.0–36.0)
MCV: 85.1 fL (ref 80.0–100.0)
Platelets: 238 10*3/uL (ref 150–400)
RBC: 4.98 MIL/uL (ref 3.87–5.11)
RDW: 12.8 % (ref 11.5–15.5)
WBC: 5 10*3/uL (ref 4.0–10.5)
nRBC: 0 % (ref 0.0–0.2)

## 2023-06-19 LAB — BASIC METABOLIC PANEL
Anion gap: 12 (ref 5–15)
BUN: 7 mg/dL (ref 6–20)
CO2: 28 mmol/L (ref 22–32)
Calcium: 9.3 mg/dL (ref 8.9–10.3)
Chloride: 100 mmol/L (ref 98–111)
Creatinine, Ser: 0.9 mg/dL (ref 0.44–1.00)
GFR, Estimated: >60 mL/min (ref >60)
Glucose, Bld: 103 mg/dL — ABNORMAL HIGH (ref 70–99)
Potassium: 3.9 mmol/L (ref 3.5–5.1)
Sodium: 140 mmol/L (ref 135–145)

## 2023-06-19 LAB — CK: Total CK: 42 U/L (ref 38–234)

## 2023-06-19 MED ORDER — HYDROCODONE-ACETAMINOPHEN 5-325 MG PO TABS
1.0000 | ORAL_TABLET | Freq: Once | ORAL | Status: AC
  Administered 2023-06-19: 1 via ORAL
  Filled 2023-06-19: qty 1

## 2023-06-19 NOTE — Discharge Instructions (Addendum)
 Danielle Fernandez:  Thank you for allowing [place] to take care of you today.  We hope you begin feeling better soon.  To-Do: Please follow-up with your primary doctor to discuss any findings from today's emergency department visit. We are sending you home with a knee immobilizer for comfort and crutches Please return to the Emergency Department or call 911 if you experience chest pain, shortness of breath, severe pain, severe fever, altered mental status, or have any reason to think that you need emergency medical care.  Thank you again.  Hope you feel better soon.  Department of Emergency Medicine [facility]

## 2023-06-19 NOTE — ED Notes (Signed)
 Back from CT

## 2023-06-19 NOTE — ED Notes (Signed)
Ortho tech returned call.   

## 2023-06-19 NOTE — ED Provider Notes (Signed)
 [place] EMERGENCY DEPARTMENT AT [facility] Provider Note  Arrival date/time:06/19/2023 11:01 PM  HPI/ROS   Danielle Fernandez is a 53 y.o. female with past medical history of T2DM, hypothyroidism, osteoarthritis presenting for fall down 4 stairs. History is provided by patient.  Patient was at home yesterday walking down her staircase when she lost her footing and fell down approximately 4 stairs, hitting her head and neck on the way down. She passed out after this event and when she woke up, she went to sleep.  She did not wake up till today and was in significant pain especially in her left knee.  She was unable to walk and so came to be evaluated in the emergency department.  Denies blood thinning medications.   A complete ROS was performed with pertinent positives/negatives noted above.   ED Course and Medical Decision Making   I personally reviewed the patient's vitals.  Assessment/Plan: Patient presents for fall down stairs yesterday with pain in left knee inhibiting ability to ambulate.  On exam patient is neurovascularly intact. However on standing, she is unable to bear full weight on left leg due to knee pain. She has no midline spinal TTP. No weakness or sensory deficit.  Given Norco for pain management.  Workup: BMP shows no metabolic derangement or AKI CK WNL at 42 CBC shows no leukocytosis or anemia  Focused imaging includes -CTH with no ICH or skull fracture -CT c-spine with no traumatic malalignment or fracture of c-spine -CT right shoulder, left knee, pelvis, and coccyx show no acute fracture or malalignment  However, patient still unable to bear weight. Will obtain CT left knee to evaluate for tibial plateau fracture.  CT scan is negative for fracture, specifically no tibial plateau fracture.  On reassessment of patient, she is able to stand but has some difficulty taking a step without assistance.  I offered admission for pain control and PT,  however patient would prefer to go home. She has a PCP who is very responsive and plans to call her in the morning. She also follows with a knee specialist for her osteoarthritis and will have her PCP get in touch with them. She does not want any prescription for pain medication as she has some at home. I offered a knee immobilizer and crutches and patient would like to proceed with those interventions and discharge home. She feels that she has good support at home and good follow up. I think this disposition is reasonable given her reassuring imaging. I advised patient to return if she has any acute concerns and she voiced understanding.  Disposition:  I discussed the plan for discharge with the patient and/or their surrogate at bedside prior to discharge and they were in agreement with the plan and verbalized understanding of the return precautions provided. All questions answered to the best of my ability. Ultimately, the patient was discharged in stable condition with stable vital signs. I am reassured that they are capable of close follow up and good social support at home.   Clinical Impression:  1. Fall, initial encounter   2. Acute pain of left knee     Rx / DC Orders ED Discharge Orders     None       The plan for this patient was discussed with Dr. Denton Lank, who voiced agreement and who oversaw evaluation and treatment of this patient.   Clinical Complexity A medically appropriate history, review of systems, and physical exam was performed.  Patient's presentation is  most consistent with acute presentation with potential threat to life or bodily function.  Medical Decision Making Amount and/or Complexity of Data Reviewed Labs: ordered. Radiology: ordered. ECG/medicine tests: ordered.  Risk Prescription drug management.    Physical Exam and Medical History   Vitals:   06/19/23 1841 06/19/23 2115  BP: 123/75 113/77  Pulse: (!) 108 (!) 102  Resp: 17 18  Temp: 98.7 F  (37.1 C)   TempSrc: Oral   SpO2: 100% 100%    Physical Exam:  General: No distress, appears well hydrated and well nourished   Head: Normocephalic, atraumatic.  No skull depressions or lacerations.  No conjunctival hemorrhage No periorbital ecchymoses, [place] Eyes, or Battle Sign bilaterally Ears atraumatic No nasal septal deviation or hematoma  PERRL, EOMI, sclera anicteric. Mucus membranes moist.    Neck: Supple, trachea midline No TTP over midline cervical spine, no step offs or deformities.  Cervical hard collar in place   Cardiovascular: RATE: regular RHYTHM: regular 2+ radial, femoral, DP pulses bilaterally   Respiratory/Chest Wall: Respiratory: normal WOB, breath sounds CTAB Clavicles stable to compression Chest stable to AP and Lateral Compression,  Chest nontender to palpation   Extremities: Warm, well perfused. No gross deformities.  TTP over left proximal tibia   Gastrointestinal: Abdomen soft, non tender, non distended   Neurologic: LOC: awake/alert EOM:  intact, conjugate   Genitourinary: deferred   Skin: Normal, no rash or lesions.   Glasgow Coma Scale: Eye opening: 4  Verbal:  5  Motor:  6  GCS Total: 15     Rectal: Deferred   Spine: No TTP along midline C/T/L spine, no step offs or deformities    Other:       Medical History: Allergies  Allergen Reactions   Metformin Rash   Shellfish Allergy Rash   Past Medical History:  Diagnosis Date   Arrhythmia    Degenerative cervical disc    Degenerative disc disease, lumbar    Diabetes mellitus without complication (HCC)    Graves disease    Hashimoto's disease    Hypertension     Past Surgical History:  Procedure Laterality Date   ABDOMINAL HYSTERECTOMY     CARDIAC SURGERY     CHOLECYSTECTOMY     HERNIA REPAIR     History reviewed. No pertinent family history.  Social History   Tobacco Use   Smoking status: Former    Current packs/day: 0.50    Types: Cigarettes   Smokeless  tobacco: Never  Substance Use Topics   Alcohol use: Not Currently    Comment: occasional   Drug use: No    Procedures   If procedures were preformed on this patient, they are listed below:  Procedures   -------- HPI and MDM generated using voice dictation software and may contain dictation errors. Please contact me for any clarification or with any questions.   Cephus Slater, MD Emergency Medicine PGY-2    Caron Presume, MD 06/19/23 8469    Cathren Laine, MD 06/20/23 [phone]

## 2023-06-19 NOTE — ED Triage Notes (Addendum)
 Pt reports she fell down 5 stairs yesterday evening around 1500. Pt C/O right shoulder, left knee, headache, and back pain. Pt also reports she had syncopal episode for an unknown amount of time. Pt stating that she slept "close to 24 hours." No blood thinners.

## 2023-06-19 NOTE — ED Provider Triage Note (Signed)
 Emergency Medicine Provider Triage Evaluation Note  Danielle Fernandez , a 53 y.o. female  was evaluated in triage.  Pt complains of head, neck, right shoulder, left knee pain after a fall yesterday.  Patient missed 5 steps and rolled down the stairs and hit her head.  Patient unsure of LOC.  Patient is not on any blood thinners.  Patient states that she got up and sat back down and then slept for 24 hours and still has pain.  Patient denies new onset weakness, vision changes, chest pain, shortness of breath, abdominal pain, nauseous vomiting.  Review of Systems  Positive:  Negative:   Physical Exam  BP 123/75 (BP Location: Right Arm)   Pulse (!) 108   Temp 98.7 F (37.1 C) (Oral)   Resp 17   SpO2 100%  Gen:   Awake, no distress   Resp:  Normal effort  MSK:   Moves extremities without difficulty  Other:  General tenderness to right shoulder and left knee  Medical Decision Making  Medically screening exam initiated at 6:56 PM.  Appropriate orders placed.  Danielle Fernandez was informed that the remainder of the evaluation will be completed by another provider, this initial triage assessment does not replace that evaluation, and the importance of remaining in the ED until their evaluation is complete.  Patient is placed in c-collar, imaging ordered, patient stable.   Netta Corrigan, [place] 06/19/23 1857

## 2023-06-19 NOTE — ED Notes (Signed)
 Ortho tech paged

## 2023-06-20 NOTE — Progress Notes (Signed)
 Orthopedic Tech Progress Note Patient Details:  Oneta Sigman 1971-01-05 [id number] Applied knee immobilizer and gave pt instructions on how to use crutches per order. Ortho Devices Type of Ortho Device: Crutches, Knee Immobilizer Ortho Device/Splint Location: LLE Ortho Device/Splint Interventions: Ordered, Application, Adjustment   Post Interventions Patient Tolerated: Well Instructions Provided: Adjustment of device, Care of device  Blase Mess 06/20/2023, 1:02 AM

## 2023-06-20 NOTE — ED Notes (Signed)
 Patient left with family before discharge vitals could be collected.

## 2023-07-22 ENCOUNTER — Other Ambulatory Visit: Payer: Self-pay | Admitting: Family Medicine

## 2023-07-22 DIAGNOSIS — S83105A Unspecified dislocation of left knee, initial encounter: Secondary | ICD-10-CM

## 2023-07-22 DIAGNOSIS — M25562 Pain in left knee: Secondary | ICD-10-CM

## 2023-07-25 ENCOUNTER — Ambulatory Visit
Admission: RE | Admit: 2023-07-25 | Discharge: 2023-07-25 | Disposition: A | Discharge status: Home / Self Care | Source: Ambulatory Visit | Attending: Family Medicine | Admitting: Family Medicine

## 2023-07-25 DIAGNOSIS — M25562 Pain in left knee: Secondary | ICD-10-CM

## 2023-07-25 DIAGNOSIS — S83105A Unspecified dislocation of left knee, initial encounter: Secondary | ICD-10-CM

## 2023-08-29 ENCOUNTER — Encounter (HOSPITAL_COMMUNITY): Payer: Self-pay

## 2023-08-29 ENCOUNTER — Emergency Department (HOSPITAL_COMMUNITY)
Admission: EM | Admit: 2023-08-29 | Discharge: 2023-08-29 | Discharge status: Against Medical Advice | Attending: Emergency Medicine | Admitting: Emergency Medicine

## 2023-08-29 ENCOUNTER — Other Ambulatory Visit: Payer: Self-pay

## 2023-08-29 DIAGNOSIS — Z794 Long term (current) use of insulin: Secondary | ICD-10-CM | POA: Diagnosis not present

## 2023-08-29 DIAGNOSIS — Z5321 Procedure and treatment not carried out due to patient leaving prior to being seen by health care provider: Secondary | ICD-10-CM | POA: Diagnosis not present

## 2023-08-29 DIAGNOSIS — W19XXXA Unspecified fall, initial encounter: Secondary | ICD-10-CM | POA: Diagnosis not present

## 2023-08-29 DIAGNOSIS — E119 Type 2 diabetes mellitus without complications: Secondary | ICD-10-CM | POA: Diagnosis not present

## 2023-08-29 DIAGNOSIS — R531 Weakness: Secondary | ICD-10-CM | POA: Diagnosis present

## 2023-08-29 LAB — CBC
HCT: 41.5 % (ref 36.0–46.0)
Hemoglobin: 13.1 g/dL (ref 12.0–15.0)
MCH: 26 pg (ref 26.0–34.0)
MCHC: 31.6 g/dL (ref 30.0–36.0)
MCV: 82.5 fL (ref 80.0–100.0)
Platelets: 237 10*3/uL (ref 150–400)
RBC: 5.03 MIL/uL (ref 3.87–5.11)
RDW: 13.2 % (ref 11.5–15.5)
WBC: 5.9 10*3/uL (ref 4.0–10.5)
nRBC: 0 % (ref 0.0–0.2)

## 2023-08-29 LAB — URINALYSIS, ROUTINE W REFLEX MICROSCOPIC
Bilirubin Urine: NEGATIVE
Glucose, UA: NEGATIVE mg/dL
Hgb urine dipstick: NEGATIVE
Ketones, ur: NEGATIVE mg/dL
Leukocytes,Ua: NEGATIVE
Nitrite: NEGATIVE
Protein, ur: NEGATIVE mg/dL
Specific Gravity, Urine: 1.005 (ref 1.005–1.030)
pH: 5 (ref 5.0–8.0)

## 2023-08-29 LAB — COMPREHENSIVE METABOLIC PANEL WITH GFR
ALT: 18 U/L (ref 0–44)
AST: 17 U/L (ref 15–41)
Albumin: 3.8 g/dL (ref 3.5–5.0)
Alkaline Phosphatase: 73 U/L (ref 38–126)
Anion gap: 12 (ref 5–15)
BUN: 26 mg/dL — ABNORMAL HIGH (ref 6–20)
CO2: 26 mmol/L (ref 22–32)
Calcium: 9.7 mg/dL (ref 8.9–10.3)
Chloride: 101 mmol/L (ref 98–111)
Creatinine, Ser: 1.37 mg/dL — ABNORMAL HIGH (ref 0.44–1.00)
GFR, Estimated: 46 mL/min — ABNORMAL LOW (ref >60)
Glucose, Bld: 112 mg/dL — ABNORMAL HIGH (ref 70–99)
Potassium: 5 mmol/L (ref 3.5–5.1)
Sodium: 139 mmol/L (ref 135–145)
Total Bilirubin: 0.5 mg/dL (ref 0.0–1.2)
Total Protein: 7.6 g/dL (ref 6.5–8.1)

## 2023-08-29 LAB — I-STAT CHEM 8, ED
BUN: 35 mg/dL — ABNORMAL HIGH (ref 6–20)
Calcium, Ion: 1.14 mmol/L — ABNORMAL LOW (ref 1.15–1.40)
Chloride: 103 mmol/L (ref 98–111)
Creatinine, Ser: 1.3 mg/dL — ABNORMAL HIGH (ref 0.44–1.00)
Glucose, Bld: 110 mg/dL — ABNORMAL HIGH (ref 70–99)
HCT: 42 % (ref 36.0–46.0)
Hemoglobin: 14.3 g/dL (ref 12.0–15.0)
Potassium: 5 mmol/L (ref 3.5–5.1)
Sodium: 138 mmol/L (ref 135–145)
TCO2: 28 mmol/L (ref 22–32)

## 2023-08-29 LAB — T4, FREE: Free T4: 0.87 ng/dL (ref 0.61–1.12)

## 2023-08-29 LAB — LIPASE, BLOOD: Lipase: 22 U/L (ref 11–51)

## 2023-08-29 LAB — TSH: TSH: 1.727 u[IU]/mL (ref 0.350–4.500)

## 2023-08-29 NOTE — ED Notes (Signed)
 Pt called for family to pick her up and stated she is done waiting. Pt seen leaving the ED.

## 2023-08-29 NOTE — ED Provider Triage Note (Signed)
 Emergency Medicine Provider Triage Evaluation Note  Danielle Fernandez , a 53 y.o. female  was evaluated in triage.  Pt complains of weakness and fall.  She has a history of obesity, type 2 diabetes, Hashimoto's/Graves' thyroiditis, chronic pain management on OxyContin 30 and 40 mg.  She is followed at [facility].  Patient states that she has been on Ozempic for a long time.  She also started taking "something that starts with an O for my thyroid.".  She states that every time she takes the Ozempic and the other medication she becomes extremely weak and falls.  She has had multiple falls and recently hurt her knee.  Patient states that she is too weak to hold the phone to her ear.  She states that her weakness comes and goes.  She denies vomiting but has extremely poor oral intake on the Ozempic  Review of Systems  Positive: Fall and weakness Negative: Vomiting  Physical Exam  BP 126/65 (BP Location: Right Arm)   Pulse 78   Temp 98.2 F (36.8 C) (Oral)   Resp 20   Ht 5' 7.5" (1.715 m)   Wt 132.4 kg   SpO2 98%   BMI 45.04 kg/m  Gen:   Awake, no distress   Resp:  Normal effort  MSK:   Moves extremities without difficulty  Other:    Medical Decision Making  Medically screening exam initiated at 6:39 PM.  Appropriate orders placed.  Danielle Fernandez was informed that the remainder of the evaluation will be completed by another provider, this initial triage assessment does not replace that evaluation, and the importance of remaining in the ED until their evaluation is complete.  Patient appears extremely weak.  She is unable to hold the phone up to her ear during evaluation.  I have asked for stat Chem-8 to rule out hypokalemia.  If negative she can go to the lobby  Potassium wnl   Danielle Fails, PA-C 09/01/23 1610

## 2023-08-29 NOTE — ED Triage Notes (Signed)
 Patient BIB EMS after falling and busting her lip. Patient states that she takes Ozempic and every time she takes it she feels weak and tired afterwards and think this fall is because of that. Patient states her legs gave out and she fell forward and busted her lip. No LOC
